# Patient Record
Sex: Female | Born: 1950 | Hispanic: No | Marital: Married | Smoking: Never smoker
Health system: Southern US, Community
[De-identification: ages and names within clinical notes are randomized; demographics above are authoritative.]

## PROBLEM LIST (undated history)

## (undated) DIAGNOSIS — G4730 Sleep apnea, unspecified: Secondary | ICD-10-CM

## (undated) DIAGNOSIS — I10 Essential (primary) hypertension: Secondary | ICD-10-CM

## (undated) DIAGNOSIS — D869 Sarcoidosis, unspecified: Secondary | ICD-10-CM

## (undated) HISTORY — PX: BREAST BIOPSY: SHX20

## (undated) HISTORY — DX: Sleep apnea, unspecified: G47.30

## (undated) HISTORY — DX: Essential (primary) hypertension: I10

## (undated) HISTORY — PX: BREAST EXCISIONAL BIOPSY: SUR124

## (undated) HISTORY — PX: NASAL SINUS SURGERY: SHX719

## (undated) HISTORY — DX: Sarcoidosis, unspecified: D86.9

---

## 1998-07-24 ENCOUNTER — Ambulatory Visit (HOSPITAL_COMMUNITY): Admission: RE | Admit: 1998-07-24 | Discharge: 1998-07-24 | Payer: Self-pay | Admitting: Cardiology

## 1999-07-23 ENCOUNTER — Other Ambulatory Visit: Admission: RE | Admit: 1999-07-23 | Discharge: 1999-07-23 | Payer: Self-pay | Admitting: Otolaryngology

## 1999-07-23 ENCOUNTER — Encounter (INDEPENDENT_AMBULATORY_CARE_PROVIDER_SITE_OTHER): Payer: Self-pay | Admitting: Specialist

## 2000-04-14 ENCOUNTER — Other Ambulatory Visit: Admission: RE | Admit: 2000-04-14 | Discharge: 2000-04-14 | Payer: Self-pay | Admitting: Otolaryngology

## 2000-04-14 ENCOUNTER — Encounter (INDEPENDENT_AMBULATORY_CARE_PROVIDER_SITE_OTHER): Payer: Self-pay | Admitting: Specialist

## 2000-09-30 ENCOUNTER — Encounter: Payer: Self-pay | Admitting: Urology

## 2000-09-30 ENCOUNTER — Encounter: Admission: RE | Admit: 2000-09-30 | Discharge: 2000-09-30 | Payer: Self-pay | Admitting: Urology

## 2001-01-20 ENCOUNTER — Encounter: Payer: Self-pay | Admitting: Internal Medicine

## 2001-01-20 ENCOUNTER — Ambulatory Visit (HOSPITAL_COMMUNITY): Admission: RE | Admit: 2001-01-20 | Discharge: 2001-01-20 | Payer: Self-pay | Admitting: Internal Medicine

## 2002-01-01 ENCOUNTER — Encounter: Payer: Self-pay | Admitting: Internal Medicine

## 2002-01-01 ENCOUNTER — Ambulatory Visit (HOSPITAL_COMMUNITY): Admission: RE | Admit: 2002-01-01 | Discharge: 2002-01-01 | Payer: Self-pay | Admitting: Internal Medicine

## 2002-01-10 ENCOUNTER — Encounter: Payer: Self-pay | Admitting: Internal Medicine

## 2002-01-10 ENCOUNTER — Encounter (INDEPENDENT_AMBULATORY_CARE_PROVIDER_SITE_OTHER): Payer: Self-pay | Admitting: *Deleted

## 2002-01-10 ENCOUNTER — Ambulatory Visit (HOSPITAL_COMMUNITY): Admission: RE | Admit: 2002-01-10 | Discharge: 2002-01-10 | Payer: Self-pay | Admitting: Internal Medicine

## 2002-05-16 ENCOUNTER — Ambulatory Visit (HOSPITAL_BASED_OUTPATIENT_CLINIC_OR_DEPARTMENT_OTHER): Admission: RE | Admit: 2002-05-16 | Discharge: 2002-05-16 | Payer: Self-pay | Admitting: Internal Medicine

## 2002-05-17 ENCOUNTER — Encounter: Admission: RE | Admit: 2002-05-17 | Discharge: 2002-05-17 | Payer: Self-pay | Admitting: Infectious Diseases

## 2003-09-10 ENCOUNTER — Encounter (INDEPENDENT_AMBULATORY_CARE_PROVIDER_SITE_OTHER): Payer: Self-pay | Admitting: Specialist

## 2003-09-10 ENCOUNTER — Ambulatory Visit (HOSPITAL_COMMUNITY): Admission: RE | Admit: 2003-09-10 | Discharge: 2003-09-10 | Payer: Self-pay | Admitting: *Deleted

## 2004-07-13 ENCOUNTER — Ambulatory Visit: Payer: Self-pay | Admitting: Internal Medicine

## 2005-01-06 ENCOUNTER — Ambulatory Visit: Payer: Self-pay | Admitting: Internal Medicine

## 2005-06-14 ENCOUNTER — Ambulatory Visit: Payer: Self-pay | Admitting: Internal Medicine

## 2005-07-08 ENCOUNTER — Ambulatory Visit: Payer: Self-pay | Admitting: Internal Medicine

## 2005-07-09 ENCOUNTER — Ambulatory Visit: Payer: Self-pay | Admitting: Internal Medicine

## 2006-08-10 ENCOUNTER — Ambulatory Visit: Payer: Self-pay | Admitting: Internal Medicine

## 2007-07-31 ENCOUNTER — Ambulatory Visit: Payer: Self-pay | Admitting: Internal Medicine

## 2007-07-31 DIAGNOSIS — J3089 Other allergic rhinitis: Secondary | ICD-10-CM

## 2007-07-31 DIAGNOSIS — D869 Sarcoidosis, unspecified: Secondary | ICD-10-CM

## 2007-07-31 DIAGNOSIS — J302 Other seasonal allergic rhinitis: Secondary | ICD-10-CM | POA: Insufficient documentation

## 2007-08-03 DIAGNOSIS — G4730 Sleep apnea, unspecified: Secondary | ICD-10-CM | POA: Insufficient documentation

## 2007-08-03 DIAGNOSIS — I10 Essential (primary) hypertension: Secondary | ICD-10-CM | POA: Insufficient documentation

## 2007-08-03 DIAGNOSIS — J45998 Other asthma: Secondary | ICD-10-CM | POA: Insufficient documentation

## 2008-04-23 ENCOUNTER — Encounter: Payer: Self-pay | Admitting: Internal Medicine

## 2008-04-23 ENCOUNTER — Telehealth: Payer: Self-pay | Admitting: Internal Medicine

## 2008-07-17 ENCOUNTER — Ambulatory Visit: Payer: Self-pay | Admitting: Internal Medicine

## 2009-01-27 ENCOUNTER — Telehealth: Payer: Self-pay | Admitting: Internal Medicine

## 2009-07-16 ENCOUNTER — Ambulatory Visit: Payer: Self-pay | Admitting: Internal Medicine

## 2010-03-30 ENCOUNTER — Other Ambulatory Visit: Payer: Self-pay | Admitting: Internal Medicine

## 2010-04-02 NOTE — Assessment & Plan Note (Signed)
Summary: 1 year return/mhh   Primary Provider/Referring Provider:  Renne Crigler  CC:  Yearly follow up visit-sarcoidosis and allergies; no complaints; needs 90 day refills for Advair, Singulair, and and ProAir HFA. Marland Kitchen  History of Present Illness: History of Present Illness: 6/09- First visit  in a long time for this woman followed with remote history of mild sarcoid, allergic rhinitis and asthma.  She had been previously evaluated for sleep apnea, but that improved, and she sleeps on her side.  Today she says she has been coughing since yesterday with chest tightness, but she does not think she has a cold.  Cough is not productive.  There has been been no fever or adenopathy, bloody or purulent discharge chest pain or palpitation,  GI upset or ankle edema.  We reviewed her medications.  Aug 05, 2008- Mild sarcoid, allergic rhinitis, asthma Had sinus surgery by Dr Annalee Genta- went well.  Breathing well now. Got through this spring better than last year, even at their house in [place].   Jul 16, 2009- Mild Sarcoid, allergic rhinitis, asthma Doing very well, coming for med refills. She thinks she is better for having had the sinus surgery last year. There have been no major events since last here. Uses flonase once each night. CXR last year showed NAD, no sign of active sarcoid.      Current Medications (verified): 1)  Advair Diskus 100-50 Mcg/dose  Misc (Fluticasone-Salmeterol) .... Inhale 1 Puff Two Times A Day 2)  Singulair 10 Mg  Tabs (Montelukast Sodium) .... Once Daily 3)  Micardis Hct 80-12.5 Mg Tabs (Telmisartan-Hctz) .... Take 1 By Mouth Once Daily 4)  Mirapex 0.125 Mg  Tabs (Pramipexole Dihydrochloride) .... 1/2 By Mouth At Bedtime 5)  Protonix 40 Mg  Tbec (Pantoprazole Sodium) .... Take 1 By Mouth Once Daily 6)  Proair Hfa 108 (90 Base) Mcg/act Aers (Albuterol Sulfate) .... Inhale 2 Puffs Every Four Hours As Needed 7)  Vitamin C 500 Mg Tabs (Ascorbic Acid) .... Take 1 By Mouth Once  Daily 8)  Fish Oil 1000 Mg Caps (Omega-3 Fatty Acids) .... Take 1 By Mouth Once Daily 9)  Calcium 500 Mg Tabs (Calcium Carbonate) .... Take 1 By Mouth Once Daily 10)  Glucosamine 1500 Complex  Caps (Glucosamine-Chondroit-Vit C-Mn) .... Take 1 By Mouth Once Daily 11)  Coq10 100 Mg Caps (Coenzyme Q10) .... Take 1 By Mouth Once Daily 12)  Fluticasone Propionate 50 Mcg/act Susp (Fluticasone Propionate) .Marland Kitchen.. 1-2 Sprays Each Nostril Daily  Allergies (verified): 1)  ! Vancomycin 2)  ! Codeine  Past History:  Past Medical History: Last updated: 07/31/2007 Sarcoidosis-stage I Allergic Rhinitis Asthma Hypertension  Hx of Sleep Apnea Insomnia  Past Surgical History: Last updated: 2008/08/05 Sinus surgery  Breast bx- benign  Family History: Last updated: August 05, 2008 Mother- deceased age 63; COPD Father- deceased age 50; PE 1 Brother-deceased age 72; Brain Tumor 4 Sisters- 3 living ages 41, 68, 4; hypertension                  1 deceased age 76; liver cancer.                    2 with breast cancer  Social History: Last updated: 07/31/2007 Married. No children Non-smoker Social ETOH.  Review of Systems      See HPI  The patient denies shortness of breath with activity, shortness of breath at rest, productive cough, non-productive cough, coughing up blood, chest pain, irregular heartbeats, acid heartburn, indigestion, loss of  appetite, weight change, difficulty swallowing, sore throat, tooth/dental problems, headaches, nasal congestion/difficulty breathing through nose, and sneezing.    Vital Signs:  Patient profile:   60 year old female Height:      67 inches Weight:      159 pounds BMI:     24.99 O2 Sat:      98 % on Room air Pulse rate:   80 / minute BP sitting:   132 / 80  (left arm) Cuff size:   regular  Vitals Entered By: Reynaldo Minium CMA (Jul 16, 2009 11:12 AM)  O2 Flow:  Room air  Physical Exam  Additional Exam:  General: A/Ox3; pleasant and cooperative,  NAD, SKIN: no rash, lesions NODES: no lymphadenopathy HEENT: [place]/AT, EOM- WNL, Conjuctivae- clear, PERRLA, TM-WNL, Nose- clear, Throat- clear and wnl. Mallampati  II NECK: Supple w/ fair ROM, JVD- none, normal carotid impulses w/o bruits Thyroid-  CHEST: Clear to P&A HEART: RRR, no m/g/r heard ABDOMEN:  ZOX:WRUE, nl pulses, no edema  NEURO: Grossly intact to observation      Impression & Recommendations:  Problem # 1:  ALLERGIC RHINITIS (ICD-477.9)  Good control with incidental itching eyes. She has access to eye drops if needed Her updated medication list for this problem includes:    Fluticasone Propionate 50 Mcg/act Susp (Fluticasone propionate) .Marland Kitchen... 1-2 sprays each nostril daily  Problem # 2:  ASTHMA (ICD-493.90) With maintnence advair and singulair, her control has been quite good. Never uses her Proair. Better control of sinus disease associated with better control of asthma as well.  Medications Added to Medication List This Visit: 1)  Micardis Hct 80-12.5 Mg Tabs (Telmisartan-hctz) .... Take 1 by mouth once daily  Other Orders: Est. Patient Level II (45409)  Patient Instructions: 1)  Please schedule a follow-up appointment in 1 year. 2)  Meds refilled Prescriptions: FLUTICASONE PROPIONATE 50 MCG/ACT SUSP (FLUTICASONE PROPIONATE) 1-2 sprays each nostril daily  #3 x 3   Entered and Authorized by:   Waymon Budge MD   Signed by:   Waymon Budge MD on 07/16/2009   Method used:   Print then Give to Patient   RxID:   [id number] PROAIR HFA 108 (90 BASE) MCG/ACT AERS (ALBUTEROL SULFATE) Inhale 2 puffs every four hours as needed  #3 x 3   Entered and Authorized by:   Waymon Budge MD   Signed by:   Waymon Budge MD on 07/16/2009   Method used:   Print then Give to Patient   RxID:   [id number] SINGULAIR 10 MG  TABS (MONTELUKAST SODIUM) once daily  #90 x 3   Entered and Authorized by:   Waymon Budge MD   Signed by:   Waymon Budge MD on  07/16/2009   Method used:   Print then Give to Patient   RxID:   [id number] ADVAIR DISKUS 100-50 MCG/DOSE  MISC (FLUTICASONE-SALMETEROL) Inhale 1 puff two times a day  #3 x 3   Entered and Authorized by:   Waymon Budge MD   Signed by:   Waymon Budge MD on 07/16/2009   Method used:   Print then Give to Patient   RxID:   [id number]

## 2010-07-10 ENCOUNTER — Ambulatory Visit (INDEPENDENT_AMBULATORY_CARE_PROVIDER_SITE_OTHER): Payer: Commercial Managed Care - PPO | Admitting: Internal Medicine

## 2010-07-10 ENCOUNTER — Encounter: Payer: Self-pay | Admitting: Internal Medicine

## 2010-07-10 VITALS — BP 104/64 | HR 70 | Ht 67.0 in | Wt 160.0 lb

## 2010-07-10 DIAGNOSIS — G4730 Sleep apnea, unspecified: Secondary | ICD-10-CM

## 2010-07-10 DIAGNOSIS — J45909 Unspecified asthma, uncomplicated: Secondary | ICD-10-CM

## 2010-07-10 DIAGNOSIS — D869 Sarcoidosis, unspecified: Secondary | ICD-10-CM

## 2010-07-10 DIAGNOSIS — J309 Allergic rhinitis, unspecified: Secondary | ICD-10-CM

## 2010-07-10 MED ORDER — FLUTICASONE PROPIONATE 50 MCG/ACT NA SUSP: 2.0000 | Freq: Every day | NASAL | Status: DC

## 2010-07-10 MED ORDER — FLUTICASONE-SALMETEROL 100-50 MCG/DOSE IN AEPB: 1.0000 | INHALATION_SPRAY | Freq: Two times a day (BID) | RESPIRATORY_TRACT | Status: DC

## 2010-07-10 MED ORDER — MONTELUKAST SODIUM 10 MG PO TABS: 10.0000 mg | ORAL_TABLET | Freq: Every day | ORAL | Status: DC

## 2010-07-10 NOTE — Patient Instructions (Signed)
Continue present meds- scripts refilled

## 2010-07-10 NOTE — Assessment & Plan Note (Addendum)
Mild seasonal exacerbation. Antihistamines, drops for allergic conjunctivitis, and fluticasone nasal spray have helped as needed.

## 2010-07-10 NOTE — Assessment & Plan Note (Signed)
Inactive, off CPAP after weight loss. Dr Renne Crigler treats restless legs with Mirapex.

## 2010-07-10 NOTE — Assessment & Plan Note (Signed)
Mild intermittent. Controlled with Advair- discussed. Consider reduction to once daily.

## 2010-07-10 NOTE — Progress Notes (Signed)
  Subjective:    Patient ID: Joy Rojas, female    DOB: 1950/10/18, 60 y.o.   MRN: [id number]  HPI 07/10/10- 61 yoF never smoker, followed for allergic rhinitis and hx mild intermittent asthma. Complicating past hx of Sarcoid, hypertension, sleep apnea with restless legs.  No significant respiratory events in past year. Minor Spring allergy eye burning and watering- managed with eye drops. Little nasal congestion or sneeze and no wheeze or chest tightness. She continues regular use of Advair, rare need for rescue inhaler. Using Singulair- discussed.  She is pleased and doesn't want changes.    Review of Systems See HPI Constitutional:   No weight loss, night sweats,  Fevers, chills, fatigue, lassitude. HEENT:   No headaches,  Difficulty swallowing,  Tooth/dental problems,  Sore throat,                No sneezing, itching, ear ache, nasal congestion, post nasal drip,   CV:  No chest pain,  Orthopnea, PND, swelling in lower extremities, anasarca, dizziness, palpitations  GI  No heartburn, indigestion, abdominal pain, nausea, vomiting, diarrhea, change in bowel habits, loss of appetite  Resp: No shortness of breath with exertion or at rest.  No excess mucus, no productive cough,  No non-productive cough,  No coughing up of blood.  No change in color of mucus.  No wheezing.  Skin: no rash or lesions.  GU: no dysuria, change in color of urine, no urgency or frequency.  No flank pain.  MS:  No joint pain or swelling.  No decreased range of motion.  No back pain.  Psych:  No change in mood or affect. No depression or anxiety.  No memory loss.      Objective:   Physical Exam General- Alert, Oriented, Affect-appropriate, Distress- none acute  Skin- rash-none, lesions- none, excoriation- none  Lymphadenopathy- none  Head- atraumatic  Eyes- Gross vision intact, PERRLA, conjunctivae clear secretions  Ears- Hearing, canals, Tm  Nose- Clear, No-  Septal dev, mucus, polyps, erosion,  perforation   Throat- Mallampati II , mucosa clear , drainage- none, tonsils- atrophic  Neck- flexible , trachea midline, no stridor , thyroid nl, carotid no bruit  Chest - symmetrical excursion , unlabored     Heart/CV- RRR , no murmur , no gallop  , no rub, nl s1 s2                     - JVD- none , edema- none, stasis changes- none, varices- none     Lung- clear to P&A, wheeze- none, cough- none , dullness-none, rub- none     Chest wall-   Abd- tender-no, distended-no, bowel sounds-present, HSM- no  Br/ Gen/ Rectal- Not done, not indicated  Extrem- cyanosis- none, clubbing, none, atrophy- none, strength- nl  Neuro- grossly intact to observation         Assessment & Plan:

## 2010-07-10 NOTE — Assessment & Plan Note (Signed)
In remission for years and probably a closed topic.

## 2010-07-14 NOTE — Assessment & Plan Note (Signed)
[facility]                             PULMONARY OFFICE NOTE   NAME:Joy Rojas, Joy Rojas                        MRN:          [id number]  DATE:08/10/2006                            DOB:          [birth year]    PROBLEMS:  1. Obstructive sleep apnea.  2. Hypertension.  3. Sarcoid, stage 1.  4. Allergic rhinitis/rhinosinusitis.  5. Asthma.  6. Insomnia.   HISTORY:  One year follow up. She has done very well. She had some rash  discussed with Dorinda Hill and treated with topical cortisone. Dr.  Renne Crigler had changed her blood pressure medication, because she had falling  episodes and she is seeing Dr. Lesia Sago for neurology evaluation  with a work up negative for specific findings or evidence of CNS sarcoid  as she understands it. She changed her pillow, has lost some weight, and  says that she is not snoring at all, and does not notice daytime  sleepiness.   MEDICATIONS:  1. Advair 100/50.  2. Singulair.  3. Benicar.  4. Mirapex 1/2 x0.125 mg nightly.  5. Protonix.   Drug intolerant VANCOMYCIN and CODEINE.   OBJECTIVE:  Weight 156 pounds, blood pressure 96/60, pulse 71, room air  saturation 98%. She looks quite well. I do not find adenopathy. Lung  fields are very clear. Heart sounds regular with no extra beats or  murmur. There is no peripheral edema.   IMPRESSION:  Currently stable.   PLAN:  I refilled Singulair and Advair. She will return again in a year,  earlier p.r.n.     Clinton D. Maple Hudson, MD, Tonny Bollman, FACP  Electronically Signed    CDY/MedQ  DD: 08/14/2006  DT: 08/15/2006  Job #: [id number]   cc:   Soyla Murphy. Renne Crigler, M.D.

## 2010-07-17 NOTE — Op Note (Signed)
NAME:  Joy Rojas, Joy Rojas                             ACCOUNT NO.:  [id number]   MEDICAL RECORD NO.:  [id number]                    PATIENT TYPE:   LOCATION:                                       FACILITY:   PHYSICIAN:  Clinton D. Maple Hudson, M.D.              DATE OF BIRTH:  12-29-1950   DATE OF PROCEDURE:  01/10/2002  DATE OF DISCHARGE:                                 OPERATIVE REPORT   PROCEDURE:  Bronchoscopy.   SURGEON:  Clinton D. Maple Hudson, M.D.   INDICATIONS FOR PROCEDURE:  60 year-old non-smoking woman with  persistent sinus congestion, bilateral hilar adenopathy on chest x-ray,  elevated angiotensin converting enzyme level, negative PPD.  She is  suspected to have sarcoidosis which may also be the basis for her persistent  sinus disease. Bronchoscopy is performed for tissue confirmation and  evaluation.  Preoperative evaluation revealed an oxygen saturation of 98%,  mild nasal congestion without visible drainage, no peripheral adenopathy,  quiet lung fields, normal heart sounds and no cyanosis or edema.  She is  considered stable for bronchoscopy.   MEDICATIONS:  1. Advair 100/50.  2. Flonase.  3. Singulair.  4. Cardizem CD 120 mg.   DESCRIPTION OF PROCEDURE:  After fully informed consent bronchoscopy was  performed in the endoscopy suite on an outpatient basis.  Premedication was  with Demerol and Atropine.  The upper airway was anesthetized topically with  Cetacaine spray, 1% Xylocaine.  Oxygen was provided at eight liters per  minute by nasal prongs achieving saturations over 90%.  The cardiac monitor  showed normal sinus rhythm.  A cumulative dose of 5 mg of intravenous Versed  was required during the procedure for additional sedation and cough control.  An Olympus fiberoptic bronchoscope was advanced via the right nostril to the  level of the vocal cords without difficulty.  The cords and trachea were  unremarkable and cough reflex was moderate.  The main carina was  normal.  Sequential examination of each lobar and segmental airway bilaterally to the  fourth division level revealed normal mucosa, clear secretions and normal  anatomy.  Under fluoroscopic guidance the bronchoscope was directed to the  right upper lobe and right middle lobe.  These areas were brushed, biopsied  by standard transbronchial biopsy and visually examined.  There was modest  self limited bleeding with no apparent complications pending chest x-ray.  She tolerated the procedure very well.  She will be held until stable and  then released home with family to office follow-up.   FINAL IMPRESSION:  Probable sarcoid.                                               Clinton D. Maple Hudson, M.D.   CDY/MEDQ  D:  01/10/2002  T:  01/10/2002  Job:  [id number]   cc:   Awanda Mink, M.D.

## 2010-07-17 NOTE — Op Note (Signed)
NAME:  Joy Rojas, Joy Rojas                           ACCOUNT NO.:  [id number]   MEDICAL RECORD NO.:  [id number]                   PATIENT TYPE:  AMB   LOCATION:  ENDO                                 FACILITY:  [facility]   PHYSICIAN:  Georgiana Spinner, M.D.                 DATE OF BIRTH:  02-01-1951   DATE OF PROCEDURE:  09/10/2003  DATE OF DISCHARGE:                                 OPERATIVE REPORT   PROCEDURE:  Upper endoscopy.   ENDOSCOPIST:  Georgiana Spinner, M.D.   INDICATIONS FOR PROCEDURE:  Hemoccult positivity, presumed iron deficiency  anemia.   ANESTHESIA:  Demerol 100 mg, Versed 8 mg.   DESCRIPTION OF PROCEDURE:  With the patient mildly sedated in the left  lateral decubitus position, the Olympus videoscopic endoscope was inserted  in the mouth and passed under direct vision to the esophagus, which appeared  normal, into the stomach.  The fundus, body, antrum, duodenal bulb and  second portion fo the duodenum were visualized.  From this point the  endoscope was slowly withdrawn, taking circumferential views of the duodenal  mucosa.  The endoscope was then pulled back into the stomach and placed on  retroflexion to view the stomach from below.  The endoscope was straightened  and withdrawn, taking circumferential views of the remaining gastric and  esophageal mucosa, stopping first in the antrum and next in the fundus where  polyps were seen, one in each area.  Both were photographed and both were  removed using biopsy forceps.  Tissue was retrieved for pathology.  The  endoscope was withdrawn.  The patient's vital signs and pulse oximeter  remained stable.  The patient tolerated the procedure well without apparent complications.   FINDINGS:  Very benign-appearing polyps, one in the antrum, one in the  fundus.  Both were removed.  I await the biopsy report.   DISPOSITION:  The patient will call me for results and follow up with me as  an outpatient.  Proceed to a colonoscopy is  planned.                                               Georgiana Spinner, M.D.    GMO/MEDQ  D:  09/10/2003  T:  09/10/2003  Job:  [id number]   cc:   S. Kyra Manges, M.D.  [phone] [address]  [place]  [place] [address]  Fax: [phone]

## 2010-07-17 NOTE — Op Note (Signed)
NAME:  Joy Rojas, Joy Rojas                           ACCOUNT NO.:  [id number]   MEDICAL RECORD NO.:  [id number]                   PATIENT TYPE:  AMB   LOCATION:  ENDO                                 FACILITY:  [facility]   PHYSICIAN:  Georgiana Spinner, M.D.                 DATE OF BIRTH:  1950-07-28   DATE OF PROCEDURE:  09/10/2003  DATE OF DISCHARGE:                                 OPERATIVE REPORT   PROCEDURE:  Colonoscopy.   ENDOSCOPIST:  Georgiana Spinner, M.D.   INDICATIONS FOR PROCEDURE:  Iron deficiency anemia, presumed Hemoccult  positivity.   ANESTHESIA:  Demerol 25 mg, Versed 2 mg.   DESCRIPTION OF PROCEDURE:  With the patient mildly sedated in the left  lateral decubitus position, the Olympus videoscopic colonoscope was inserted  into the rectum and passed under direct vision to the cecum.  I identified  the ileocecal valve and appendiceal orifice, both of which were  photographed.  From this point the colonoscope was then slowly withdrawn,  taking circumferential views of the colonic mucosa throughout the rectum,  which appeared normal on direct.  Showed small hemorrhoids on retroflexed  view.  The endoscope was straightened and withdrawn.  The patient's vital  signs and pulse oximeter remained stable.  The patient tolerated the procedure well, with no apparent complications.   IMPRESSION:  Unremarkable examination other than small internal hemorrhoids.   PLAN:  See the endoscopy note for further details.  May consider a  gynecological evaluation for evaluation of the blood loss anemia.                                               Georgiana Spinner, M.D.    GMO/MEDQ  D:  09/10/2003  T:  09/10/2003  Job:  [id number]   cc:   S. Kyra Manges, M.D.  [phone] [address]  [place]  [place] [address]  Fax: [phone]

## 2010-10-06 ENCOUNTER — Encounter: Payer: Self-pay | Admitting: *Deleted

## 2010-10-12 ENCOUNTER — Encounter: Payer: Self-pay | Admitting: Vascular Surgery

## 2010-10-13 ENCOUNTER — Encounter: Payer: Self-pay | Admitting: *Deleted

## 2010-10-15 ENCOUNTER — Ambulatory Visit (INDEPENDENT_AMBULATORY_CARE_PROVIDER_SITE_OTHER): Payer: Commercial Managed Care - PPO | Admitting: *Deleted

## 2010-10-15 DIAGNOSIS — I83893 Varicose veins of bilateral lower extremities with other complications: Secondary | ICD-10-CM

## 2010-10-15 NOTE — Progress Notes (Signed)
Subjective:     Patient ID: Joy Rojas, female   DOB: [birth year], 60 y.o.   MRN: [id number]  HPI   Review of Systems     Objective:   Physical Exam  Musculoskeletal:       Legs:      Assessment:         Plan:          X=.3% Sotradecol administered with a 27g butterfly.  Patient received a total of 11cc.   Photos: yes  Compression stockings applied: yes  Photos taken and compression hose size med. Applied. Ace wrap applied to left knee area over large reticular.

## 2010-10-15 NOTE — Progress Notes (Signed)
Subjective:     Patient ID: Joy Rojas, female   DOB: 02-19-1951, 60 y.o.   MRN: [id number]  HPI   Review of Systems        Physical Exam                   X=.3%* Sotradecol administered with a 27g butterfly.  Patient received a total of 11cc.  Photos: yes  Compression stockings applied: yes

## 2010-10-20 ENCOUNTER — Telehealth: Payer: Self-pay | Admitting: *Deleted

## 2010-10-20 NOTE — Telephone Encounter (Signed)
Pt. Called with a question about her stockings. They are irritating her skin at the thigh. I told her to either fold the band down or place a piece of cotton tee shirt between her skin and the hose.

## 2010-10-22 NOTE — Patient Instructions (Signed)
Pt. Given post op instructions.

## 2011-04-19 ENCOUNTER — Telehealth: Payer: Self-pay | Admitting: Internal Medicine

## 2011-04-19 MED ORDER — FLUTICASONE-SALMETEROL 100-50 MCG/DOSE IN AEPB: 1.0000 | INHALATION_SPRAY | Freq: Two times a day (BID) | RESPIRATORY_TRACT | Status: DC

## 2011-04-19 MED ORDER — MONTELUKAST SODIUM 10 MG PO TABS: 10.0000 mg | ORAL_TABLET | Freq: Every day | ORAL | Status: DC

## 2011-04-19 NOTE — Telephone Encounter (Signed)
I spoke with pt and was needing her advair and singulair rx sent to [facility]. I advised will send rx and nothing further was needed

## 2011-07-12 ENCOUNTER — Ambulatory Visit (INDEPENDENT_AMBULATORY_CARE_PROVIDER_SITE_OTHER): Payer: Commercial Managed Care - PPO | Admitting: Internal Medicine

## 2011-07-12 ENCOUNTER — Encounter: Payer: Self-pay | Admitting: Internal Medicine

## 2011-07-12 VITALS — BP 116/64 | HR 85 | Ht 67.0 in | Wt 164.0 lb

## 2011-07-12 DIAGNOSIS — G4730 Sleep apnea, unspecified: Secondary | ICD-10-CM

## 2011-07-12 DIAGNOSIS — J45998 Other asthma: Secondary | ICD-10-CM

## 2011-07-12 DIAGNOSIS — J45909 Unspecified asthma, uncomplicated: Secondary | ICD-10-CM

## 2011-07-12 DIAGNOSIS — D869 Sarcoidosis, unspecified: Secondary | ICD-10-CM

## 2011-07-12 MED ORDER — AZITHROMYCIN 250 MG PO TABS: ORAL_TABLET | ORAL | Status: AC

## 2011-07-12 NOTE — Progress Notes (Signed)
  Subjective:    Patient ID: Joy Rojas, female    DOB: 1950/04/21, 61 y.o.   MRN: [id number]  HPI 07/10/10- 77 yoF never smoker, followed for allergic rhinitis and hx mild intermittent asthma. Complicating past hx of Sarcoid, hypertension, sleep apnea with restless legs.  No significant respiratory events in past year. Minor Spring allergy eye burning and watering- managed with eye drops. Little nasal congestion or sneeze and no wheeze or chest tightness. She continues regular use of Advair, rare need for rescue inhaler. Using Singulair- discussed.  She is pleased and doesn't want changes.   07/12/11- 59 yoF never smoker, followed for allergic rhinitis and hx mild intermittent asthma. Complicating past hx of Sarcoid, hypertension, sleep apnea with restless legs.  Started yesterday with dry hacky cough; making throat sore from cough. She did not like generic Singulair and prefers brand name. She has kept her weight off and has not needed CPAP.  ROS-see HPI Constitutional:   No-   weight loss, night sweats, fevers, chills, fatigue, lassitude. HEENT:   No-  headaches, difficulty swallowing, tooth/dental problems, sore throat,       No-  sneezing, itching, ear ache, nasal congestion, post nasal drip,  CV:  No-   chest pain, orthopnea, PND, swelling in lower extremities, anasarca, dizziness, palpitations Resp: No-   shortness of breath with exertion or at rest.              No-   productive cough,  + non-productive cough,  No- coughing up of blood.              No-   change in color of mucus.  No- wheezing.   Skin: No-   rash or lesions. GI:  No-   heartburn, indigestion, abdominal pain, nausea, vomiting, GU:  MS:  No-   joint pain or swelling.   Neuro-     nothing unusual Psych:  No- change in mood or affect. No depression or anxiety.  No memory loss.  OBJ- Physical Exam General- Alert, Oriented, Affect-appropriate, Distress- none acute Skin- rash-none, lesions- none, excoriation-  none Lymphadenopathy- none Head- atraumatic            Eyes- Gross vision intact, PERRLA, conjunctivae and secretions clear            Ears- Hearing, canals-normal            Nose- Clear, no-Septal dev, mucus, polyps, erosion, perforation             Throat- Mallampati II , mucosa clear , drainage- none, tonsils- atrophic Neck- flexible , trachea midline, no stridor , thyroid nl, carotid no bruit Chest - symmetrical excursion , unlabored           Heart/CV- RRR , no murmur , no gallop  , no rub, nl s1 s2                           - JVD- none , edema- none, stasis changes- none, varices- none           Lung- clear to P&A, wheeze- none, cough- none , dullness-none, rub- none           Chest wall-  Abd-  Br/ Gen/ Rectal- Not done, not indicated Extrem- cyanosis- none, clubbing, none, atrophy- none, strength- nl Neuro- grossly intact to observation

## 2011-07-12 NOTE — Patient Instructions (Signed)
Script to hold for Z pak to use if needed  Fluids, rest and avoid getting chilled.  Please call as needed

## 2011-07-15 ENCOUNTER — Telehealth: Payer: Self-pay | Admitting: Internal Medicine

## 2011-07-15 MED ORDER — MONTELUKAST SODIUM 10 MG PO TABS: 10.0000 mg | ORAL_TABLET | Freq: Every day | ORAL | Status: DC

## 2011-07-15 MED ORDER — FLUTICASONE PROPIONATE 50 MCG/ACT NA SUSP: 2.0000 | Freq: Every day | NASAL | Status: DC

## 2011-07-15 MED ORDER — FLUTICASONE-SALMETEROL 100-50 MCG/DOSE IN AEPB: 1.0000 | INHALATION_SPRAY | Freq: Two times a day (BID) | RESPIRATORY_TRACT | Status: DC

## 2011-07-15 NOTE — Telephone Encounter (Signed)
Noted by triage Will sign off 

## 2011-07-15 NOTE — Telephone Encounter (Signed)
Patient last seen by CDY 5.13.13, follow up in 1 year > 5.19.14.  Refills sent to [facility] as requested 90-day supply with 3 refills.  LMOM TCB x1 to inform pt.  She is no longer at work for the day, so message was left on home answering machine.

## 2011-07-15 NOTE — Telephone Encounter (Signed)
Pt returned triage's call.  Advised pt hat refills were sent to [facility] as requested, 90 day supply w/ 3 refills.  Pt stated that if there is anything further needed, to please contact her on her cell, [phone].  Joy Rojas

## 2011-07-16 NOTE — Assessment & Plan Note (Addendum)
Good control. Recent mild hacking is nonspecific but probably related to the current pollen levels or a mild cold. After discussion of symptomatic therapy I agreed to give her a Z-Pak prescription to hold.

## 2011-07-16 NOTE — Assessment & Plan Note (Signed)
Controlled by weight loss 

## 2011-07-16 NOTE — Assessment & Plan Note (Signed)
Clinically in remission 

## 2011-07-31 ENCOUNTER — Emergency Department (HOSPITAL_COMMUNITY): Payer: Commercial Managed Care - PPO

## 2011-07-31 ENCOUNTER — Emergency Department (HOSPITAL_COMMUNITY)
Admission: EM | Admit: 2011-07-31 | Discharge: 2011-07-31 | Disposition: A | Discharge status: Home / Self Care | Payer: Commercial Managed Care - PPO | Attending: Emergency Medicine | Admitting: Emergency Medicine

## 2011-07-31 ENCOUNTER — Encounter (HOSPITAL_COMMUNITY): Payer: Self-pay | Admitting: Emergency Medicine

## 2011-07-31 DIAGNOSIS — I10 Essential (primary) hypertension: Secondary | ICD-10-CM | POA: Insufficient documentation

## 2011-07-31 DIAGNOSIS — G4730 Sleep apnea, unspecified: Secondary | ICD-10-CM | POA: Insufficient documentation

## 2011-07-31 DIAGNOSIS — D869 Sarcoidosis, unspecified: Secondary | ICD-10-CM | POA: Insufficient documentation

## 2011-07-31 DIAGNOSIS — Z79899 Other long term (current) drug therapy: Secondary | ICD-10-CM | POA: Insufficient documentation

## 2011-07-31 DIAGNOSIS — M79609 Pain in unspecified limb: Secondary | ICD-10-CM | POA: Insufficient documentation

## 2011-07-31 DIAGNOSIS — S93602A Unspecified sprain of left foot, initial encounter: Secondary | ICD-10-CM

## 2011-07-31 DIAGNOSIS — J45909 Unspecified asthma, uncomplicated: Secondary | ICD-10-CM | POA: Insufficient documentation

## 2011-07-31 NOTE — ED Provider Notes (Signed)
History     CSN: [id number]  Arrival date & time 07/31/11  1728   First MD Initiated Contact with Patient 07/31/11 1737      Chief Complaint  Patient presents with  . Foot Pain    (Consider location/radiation/quality/duration/timing/severity/associated sxs/prior treatment) HPI Comments: Patient reports that while she was getting out of her car earlier today she thinks she may have twisted her left foot and has been having left foot pain since that time.  Pain located in the area of the foot arch medially.  She denies any edema, erythema, or warmth of the area.  Pain worse with ambulation.  She denies any numbness.  She reports that she has never had pain like this before.  Patient is a 61 y.o. female presenting with lower extremity pain. The history is provided by the patient.  Foot Pain This is a new problem. The current episode started today. The problem occurs constantly. The problem has been unchanged. Pertinent negatives include no chills, fever, joint swelling or numbness. The symptoms are aggravated by bending and walking. She has tried nothing for the symptoms.    Past Medical History  Diagnosis Date  . Sarcoidosis     stage 1  . Allergic rhinitis   . Asthma   . Hypertension   . Sleep apnea     Past Surgical History  Procedure Date  . Nasal sinus surgery   . Breast biopsy     benign; lumpectomy, cyst removal    Family History  Problem Relation Age of Onset  . COPD Mother   . Pulmonary embolism Father   . Other Brother     brain tumor  . Hypertension Sister   . Liver cancer Sister   . Breast cancer      History  Substance Use Topics  . Smoking status: Never Smoker   . Smokeless tobacco: Never Used  . Alcohol Use: 0.6 oz/week    1 Glasses of wine per week     social use    OB History    Grav Para Term Preterm Abortions TAB SAB Ect Mult Living                  Review of Systems  Constitutional: Negative for fever and chills.  Musculoskeletal:  Negative for joint swelling.       Pain with ambulation  Skin: Negative for color change and wound.  Neurological: Negative for numbness.    Allergies  Codeine and Vancomycin  Home Medications   Current Outpatient Rx  Name Route Sig Dispense Refill  . ALBUTEROL SULFATE HFA 108 (90 BASE) MCG/ACT IN AERS Inhalation Inhale 2 puffs into the lungs 4 (four) times daily.      . COQ10 100 MG PO CAPS Oral Take 1 capsule by mouth daily.      Marland Kitchen FLUTICASONE PROPIONATE 50 MCG/ACT NA SUSP Nasal Place 2 sprays into the nose daily. 48 g 3  . FLUTICASONE-SALMETEROL 100-50 MCG/DOSE IN AEPB Inhalation Inhale 1 puff into the lungs every 12 (twelve) hours. 180 each 3  . GLUCOSAMINE 1500 COMPLEX PO CAPS Oral Take 1 capsule by mouth daily.      Marland Kitchen MONTELUKAST SODIUM 10 MG PO TABS Oral Take 1 tablet (10 mg total) by mouth at bedtime. 90 tablet 3    Dispense as written.  Marland Kitchen OLMESARTAN MEDOXOMIL-HCTZ 40-12.5 MG PO TABS Oral Take 1 tablet by mouth daily.      Marland Kitchen FISH OIL 1000 MG PO CAPS Oral Take  2 capsules by mouth daily.     Marland Kitchen PANTOPRAZOLE SODIUM 40 MG PO TBEC Oral Take 40 mg by mouth daily.      Marland Kitchen PRAMIPEXOLE DIHYDROCHLORIDE 0.125 MG PO TABS  Take 1/2 by mouth at bedtime     . VITAMIN C 500 MG PO TABS Oral Take 500 mg by mouth daily.        BP 109/66  Pulse 84  Temp(Src) 98.1 F (36.7 C) (Oral)  SpO2 98%  Physical Exam  Nursing note and vitals reviewed. Constitutional: She appears well-developed and well-nourished. No distress.  HENT:  Head: Normocephalic and atraumatic.  Cardiovascular: Normal rate, regular rhythm and normal heart sounds.   Pulses:      Dorsalis pedis pulses are 2+ on the right side, and 2+ on the left side.  Pulmonary/Chest: Effort normal and breath sounds normal.  Musculoskeletal: Normal range of motion.       Left ankle: She exhibits normal range of motion, no swelling, no ecchymosis and no deformity. no tenderness. Achilles tendon normal. Achilles tendon exhibits no pain and no  defect.       Pain with plantar and dorsiflexion of left foot.  Neurological: She is alert. No sensory deficit.       Pain with ambulation  Skin: Skin is warm and dry. She is not diaphoretic.       No erythema, edema, or warmth of left foot.  Psychiatric: She has a normal mood and affect.    ED Course  Procedures (including critical care time)  Labs Reviewed - No data to display Dg Foot Complete Left  07/31/2011  *RADIOLOGY REPORT*  Clinical Data: Sudden left foot pain in the region of the first toe and metacarpal.  LEFT FOOT - COMPLETE 3+ VIEW  Comparison: None.  Findings: Joint space narrowing and spur formation involving the first MTP joint.  Mild calcaneal spur formation.  No fracture or dislocation seen.  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  Original Report Authenticated By: Darrol Angel, M.D.     No diagnosis found.    MDM  Patient with foot pain after twisting her foot.  Negative xrays.  Neurovascularly intact.  No erythema, warmth, or swelling.  Patient given Orthopedic boot and instructed to follow up with PCP.  Patient in agreement with the plan.          Pascal Lux [place], PA-C 08/01/11 [phone]

## 2011-07-31 NOTE — ED Notes (Addendum)
Pt c/o of left foot pain that started when she got out of the car. Reports no trauma/fall/injury; just got out of the car and suddenly felt foot pain. No hx of DVT. Limited movement on left foot upon dorsiflexion and plantar flexion. Foot is painful to the touch. Bunion noted by left great toe.

## 2011-08-01 NOTE — ED Provider Notes (Signed)
Medical screening examination/treatment/procedure(s) were performed by non-physician practitioner and as supervising physician I was immediately available for consultation/collaboration.  Skyelyn Scruggs T Briunna Leicht, MD 08/01/11 1559 

## 2012-07-17 ENCOUNTER — Encounter: Payer: Self-pay | Admitting: Internal Medicine

## 2012-07-17 ENCOUNTER — Ambulatory Visit (INDEPENDENT_AMBULATORY_CARE_PROVIDER_SITE_OTHER): Payer: Commercial Managed Care - PPO | Admitting: Internal Medicine

## 2012-07-17 VITALS — BP 124/78 | HR 95 | Ht 67.0 in | Wt 167.0 lb

## 2012-07-17 DIAGNOSIS — D869 Sarcoidosis, unspecified: Secondary | ICD-10-CM

## 2012-07-17 DIAGNOSIS — J309 Allergic rhinitis, unspecified: Secondary | ICD-10-CM

## 2012-07-17 DIAGNOSIS — J45998 Other asthma: Secondary | ICD-10-CM

## 2012-07-17 DIAGNOSIS — G4730 Sleep apnea, unspecified: Secondary | ICD-10-CM

## 2012-07-17 DIAGNOSIS — J3089 Other allergic rhinitis: Secondary | ICD-10-CM

## 2012-07-17 DIAGNOSIS — J45909 Unspecified asthma, uncomplicated: Secondary | ICD-10-CM

## 2012-07-17 MED ORDER — ALBUTEROL SULFATE HFA 108 (90 BASE) MCG/ACT IN AERS: 2.0000 | INHALATION_SPRAY | RESPIRATORY_TRACT | Status: DC | PRN

## 2012-07-17 MED ORDER — FLUTICASONE PROPIONATE 50 MCG/ACT NA SUSP: 2.0000 | Freq: Every day | NASAL | Status: DC

## 2012-07-17 MED ORDER — MONTELUKAST SODIUM 10 MG PO TABS: 10.0000 mg | ORAL_TABLET | Freq: Every day | ORAL | Status: DC

## 2012-07-17 MED ORDER — FLUTICASONE-SALMETEROL 100-50 MCG/DOSE IN AEPB: 1.0000 | INHALATION_SPRAY | Freq: Two times a day (BID) | RESPIRATORY_TRACT | Status: DC

## 2012-07-17 NOTE — Progress Notes (Signed)
Subjective:    Patient ID: Joy Rojas, female    DOB: 29-Dec-1950, 62 y.o.   MRN: [id number]  HPI 07/10/10- 42 yoF never smoker, followed for allergic rhinitis and hx mild intermittent asthma. Complicating past hx of Sarcoid, hypertension, sleep apnea with restless legs.  No significant respiratory events in past year. Minor Spring allergy eye burning and watering- managed with eye drops. Little nasal congestion or sneeze and no wheeze or chest tightness. She continues regular use of Advair, rare need for rescue inhaler. Using Singulair- discussed.  She is pleased and doesn't want changes.   07/12/11- 59 yoF never smoker, followed for allergic rhinitis and hx mild intermittent asthma. Complicating past hx of Sarcoid, hypertension, sleep apnea with restless legs.  Started yesterday with dry hacky cough; making throat sore from cough. She did not like generic Singulair and prefers brand name. She has kept her weight off and has not needed CPAP.  07/17/12- 61 yoF never smoker, followed for allergic rhinitis and hx mild intermittent asthma. FOLLOWS FOR: denies any troubles/flare ups with allergies or asthma at this time. Breathing has been stable with no acute problems. She mentions and intermittent upper thoracic spine pain that reminds her of how she felt when she first developed sarcoid. She denies fever, cough, swollen glands, rash.  ROS-see HPI Constitutional:   No-   weight loss, night sweats, fevers, chills, fatigue, lassitude. HEENT:   No-  headaches, difficulty swallowing, tooth/dental problems, sore throat,       No-  sneezing, itching, ear ache, nasal congestion, post nasal drip,  CV:  No-   chest pain, orthopnea, PND, swelling in lower extremities, anasarca, dizziness, palpitations Resp: No-   shortness of breath with exertion or at rest.              No-   productive cough,  No- non-productive cough,  No- coughing up of blood.              No-   change in color of mucus.  No-  wheezing.   Skin: No-   rash or lesions. GI:  No-   heartburn, indigestion, abdominal pain, nausea, vomiting, GU:  MS:  No-   joint pain or swelling.   Neuro-     nothing unusual Psych:  No- change in mood or affect. No depression or anxiety.  No memory loss.  OBJ- Physical Exam General- Alert, Oriented, Affect-appropriate, Distress- none acute Skin- rash-none, lesions- none, excoriation- none Lymphadenopathy- none Head- atraumatic            Eyes- Gross vision intact, PERRLA, conjunctivae and secretions clear            Ears- Hearing, canals-normal            Nose- Clear, no-Septal dev, mucus, polyps, erosion, perforation             Throat- Mallampati II , mucosa clear , drainage- none, tonsils- atrophic Neck- flexible , trachea midline, no stridor , thyroid nl, carotid no bruit Chest - symmetrical excursion , unlabored           Heart/CV- RRR , no murmur , no gallop  , no rub, nl s1 s2                           - JVD- none , edema- none, stasis changes- none, varices- none           Lung- clear to P&A, wheeze-  none, cough- none , dullness-none, rub- none           Chest wall- not shock tender over thoracic spine Abd-  Br/ Gen/ Rectal- Not done, not indicated Extrem- cyanosis- none, clubbing, none, atrophy- none, strength- nl Neuro- grossly intact to observation

## 2012-07-17 NOTE — Patient Instructions (Addendum)
Scripts sent to [facility] for Singulair, Advair, [employer] for [employer]- CXR   Dx sarcoid

## 2012-07-19 ENCOUNTER — Telehealth: Payer: Self-pay | Admitting: Internal Medicine

## 2012-07-19 MED ORDER — MONTELUKAST SODIUM 10 MG PO TABS: 10.0000 mg | ORAL_TABLET | Freq: Every day | ORAL | Status: DC

## 2012-07-19 NOTE — Telephone Encounter (Signed)
i confirmed with [facility] they did NOT receive RX. I have resent this in. I called and made pt aware as well.

## 2012-07-26 NOTE — Assessment & Plan Note (Signed)
Most likely she is experiencing some arthritic/skeletal pain. I doubt which she describes related to sarcoid. Plan-chest x-ray

## 2012-07-26 NOTE — Assessment & Plan Note (Signed)
Plan-refill Flonase with discussion 

## 2012-07-26 NOTE — Assessment & Plan Note (Addendum)
Husband has told her she has little snoring and no obvious apnea any longer. we will consider this problem resolved.

## 2012-10-13 ENCOUNTER — Ambulatory Visit (INDEPENDENT_AMBULATORY_CARE_PROVIDER_SITE_OTHER)
Admission: RE | Admit: 2012-10-13 | Discharge: 2012-10-13 | Disposition: A | Discharge status: Home / Self Care | Payer: Commercial Managed Care - PPO | Source: Ambulatory Visit | Attending: Internal Medicine | Admitting: Internal Medicine

## 2012-10-13 DIAGNOSIS — D869 Sarcoidosis, unspecified: Secondary | ICD-10-CM

## 2012-10-25 ENCOUNTER — Other Ambulatory Visit: Payer: Self-pay | Admitting: Physician Assistant

## 2012-11-09 ENCOUNTER — Encounter: Payer: Self-pay | Admitting: Sports Medicine

## 2012-11-09 ENCOUNTER — Ambulatory Visit (INDEPENDENT_AMBULATORY_CARE_PROVIDER_SITE_OTHER): Payer: Commercial Managed Care - PPO | Admitting: Sports Medicine

## 2012-11-09 VITALS — BP 122/76 | Ht 67.0 in | Wt 167.0 lb

## 2012-11-09 DIAGNOSIS — M1710 Unilateral primary osteoarthritis, unspecified knee: Secondary | ICD-10-CM

## 2012-11-09 DIAGNOSIS — M25569 Pain in unspecified knee: Secondary | ICD-10-CM

## 2012-11-09 DIAGNOSIS — M25562 Pain in left knee: Secondary | ICD-10-CM

## 2012-11-09 DIAGNOSIS — M1712 Unilateral primary osteoarthritis, left knee: Secondary | ICD-10-CM

## 2012-11-09 NOTE — Progress Notes (Signed)
Patient ID: Joy Rojas, female   DOB: 1950/10/19, 62 y.o.   MRN: [id number]  This is a 62 year old female who presents with approximately 2 years of left knee pain. She reports onset of the pain gradually. No radiation of the pain is exacerbated by prolonged standing. She notes some swelling when she stands for extended periods. 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 On this day she notes fairly significant discomfort swelling and limited range of motion.  Denies any inciting injury. No pain with the right knee.  Past Medical History  Diagnosis Date  . Sarcoidosis     stage 1  . Allergic rhinitis   . Asthma   . Hypertension   . Sleep apnea    Past Surgical History  Procedure Laterality Date  . Nasal sinus surgery    . Breast biopsy      benign; lumpectomy, cyst removal   Allergies  Allergen Reactions  . Codeine     REACTION: nausea  . Vancomycin     REACTION: asthma attack    Review of systems: As per history of present illness otherwise negative.  Examination:  BP 122/76  Ht 5\' 7"  (1.702 m)  Wt 167 lb (75.751 kg)  BMI 26.15 kg/m2 This is a well-developed well-nourished 62 year old [place] female awake alert and oriented in no acute distress.  Left Knee: Inspection with slight joint effusion noted. Palpation with no warmth, positive for medial joint line tenderness and patellar crepitus with extension. ROM limited in flexion to approximately 110 degrees.  Normal extension Ligaments with solid consistent endpoints including ACL, PCL, LCL, MCL. Positive McMurray on left Non painful patellar compression. Patellar glide with crepitus. Patellar and quadriceps tendons unremarkable. Hamstring and quadriceps strength is normal.   Hip strength 5/5 to Flexion, Abduction, Adduction and Extension.  Musculoskeletal ultrasound of the left knee was performed. Findings of the ultrasound consisted of  medial meniscal extrusion with some surrounding edema. Fluid in the suprapatellar pouch and bony spurring on the tibial plateau medially and laterally. In addition there are cortical irregularities noted within the patellofemoral groove as well as the underside of the patella.

## 2012-11-09 NOTE — Assessment & Plan Note (Signed)
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 She was given a Body Helix compressive sleeve to wear for support. In addition oral anti-inflammatories as tolerated. She was given quadriceps strengthening exercises to do at home to help improve joint strength and range of motion.

## 2012-11-13 ENCOUNTER — Telehealth: Payer: Self-pay | Admitting: *Deleted

## 2012-11-13 NOTE — Telephone Encounter (Signed)
Pt called stating when she wore her knee sleeve for several hours her ankle got swollen.  Advised her to try wearing for activity and for 30 min - 1 hour after.  Advised not to wear until swelling occurs- ok to wear as tolerated during the day.  Asked pt to call back if she has any more significant ankle swelling.

## 2012-11-23 ENCOUNTER — Encounter: Payer: Self-pay | Admitting: Physician Assistant

## 2012-11-23 ENCOUNTER — Ambulatory Visit (INDEPENDENT_AMBULATORY_CARE_PROVIDER_SITE_OTHER): Payer: Commercial Managed Care - PPO | Admitting: Physician Assistant

## 2012-11-23 VITALS — BP 130/80 | HR 68 | Temp 97.8°F | Resp 18 | Ht 65.0 in | Wt 175.0 lb

## 2012-11-23 DIAGNOSIS — Z01419 Encounter for gynecological examination (general) (routine) without abnormal findings: Secondary | ICD-10-CM

## 2012-11-23 DIAGNOSIS — Z1239 Encounter for other screening for malignant neoplasm of breast: Secondary | ICD-10-CM

## 2012-11-23 NOTE — Progress Notes (Signed)
Patient ID: Joy Rojas MRN: [id number], DOB: September 10, 1950, 62 y.o. Date of Encounter: 11/23/2012, 8:51 AM    Chief Complaint:  Chief Complaint  Patient presents with  . PAP only     HPI: 62 y.o. year old female here for GYN exam including breast exam and pelvic exam. She saw Dr. Lelon Perla in the past for her GYN care. Since he retired she has been seeing me for this. She goes to [facility] for her routine general medical care.  She has had no abnormal GYN history. She has had no children. She has had no hysterectomy. She is on no GYN medications. On no hormone replacement therapy etc. She has no personal history of any GYN cancers. However she does have 2 sisters who have had breast cancer. No other family history of breast cancer. There is no family history of any uterine or ovarian cancers.  She has no complaints today. She just had a mammogram earlier this week and already knows that the results were normal.     Home Meds: See attached medication section for any medications that were entered at today's visit. The computer does not put those onto this list.The following list is a list of meds entered prior to today's visit.   Current Outpatient Prescriptions on File Prior to Visit  Medication Sig Dispense Refill  . albuterol (PROAIR HFA) 108 (90 BASE) MCG/ACT inhaler Inhale 2 puffs into the lungs every 4 (four) hours as needed for wheezing or shortness of breath.  1 Inhaler  prn  . Coenzyme Q10 (COQ10) 100 MG CAPS Take 1 capsule by mouth daily.        . fluticasone (FLONASE) 50 MCG/ACT nasal spray Place 2 sprays into the nose daily.  48 g  3  . Fluticasone-Salmeterol (ADVAIR DISKUS) 100-50 MCG/DOSE AEPB Inhale 1 puff into the lungs every 12 (twelve) hours. Rinse mouth  180 each  3  . Glucosamine-Chondroit-Vit C-Mn (GLUCOSAMINE 1500 COMPLEX) CAPS Take 1 capsule by mouth daily.        . montelukast (SINGULAIR) 10 MG tablet Take 1 tablet (10 mg total) by mouth at bedtime.  90  tablet  3  . olmesartan-hydrochlorothiazide (BENICAR HCT) 40-12.5 MG per tablet Take 1 tablet by mouth daily.        . Omega-3 Fatty Acids (FISH OIL) 1000 MG CAPS Take 2 capsules by mouth daily.       . pantoprazole (PROTONIX) 40 MG tablet Take 40 mg by mouth daily.        . pramipexole (MIRAPEX) 0.125 MG tablet Take 1/2 by mouth at bedtime        No current facility-administered medications on file prior to visit.    Allergies:  Allergies  Allergen Reactions  . Codeine     REACTION: nausea  . Vancomycin     REACTION: asthma attack      Review of Systems: See HPI for pertinent ROS. All other ROS negative.    Physical Exam: Blood pressure 130/80, pulse 68, temperature 97.8 F (36.6 C), temperature source Oral, resp. rate 18, height 5\' 5"  (1.651 m), weight 175 lb (79.379 kg)., Body mass index is 29.12 kg/(m^2). General: Well-nourished well-developed female. Very pleasant. Appears in no acute distress. Neck: Supple. No thyromegaly. No lymphadenopathy. Lungs: Clear bilaterally to auscultation without wheezes, rales, or rhonchi. Breathing is unlabored. Heart: Regular rhythm. No murmurs, rubs, or gallops. Abdomen: Soft, non-tender, non-distended with normoactive bowel sounds. No hepatomegaly. No rebound/guarding. No obvious abdominal masses. Breast exam:  Breasts are symmetrical bilaterally. They are soft with no fibrocystic changes and no masses. No abnormalities of the skin. No nipple discharge. No enlarged or tender lymph nodes. Pelvic exam: External genitalia are normal. Vaginal mucosa normal. Cervix is normal. Bimanual exam is normal with no mass and no area of tenderness. Msk:  Strength and tone normal for age. Psych:  Responds to questions appropriately with a normal affect.very pleasant .     ASSESSMENT AND PLAN:  62 y.o. year old female with  1. Screening breast examination Normal. Discussed monthly self breast exams. She just had mammogram earlier this week that was  normal.  2. Visit for pelvic exam Normal. Not need repeat Pap smear at this time. Last Pap smear was 11/09/2010. Cytology was normal. HPV was negative.  Followup GYN exam in 1 year.   Murray [place], [place], [facility] 11/23/2012 8:51 AM

## 2013-01-03 ENCOUNTER — Ambulatory Visit: Payer: Commercial Managed Care - PPO | Admitting: Sports Medicine

## 2013-04-09 ENCOUNTER — Telehealth: Payer: Self-pay | Admitting: Internal Medicine

## 2013-04-09 MED ORDER — MONTELUKAST SODIUM 10 MG PO TABS: 10.0000 mg | ORAL_TABLET | Freq: Every day | ORAL | Status: DC

## 2013-04-09 NOTE — Telephone Encounter (Signed)
Rx has been sent in. Pt is aware. 

## 2013-05-28 ENCOUNTER — Telehealth: Payer: Self-pay | Admitting: Internal Medicine

## 2013-05-28 MED ORDER — FLUTICASONE-SALMETEROL 100-50 MCG/DOSE IN AEPB: 1.0000 | INHALATION_SPRAY | Freq: Two times a day (BID) | RESPIRATORY_TRACT | Status: DC

## 2013-05-28 NOTE — Telephone Encounter (Signed)
Rx has been sent in. Pt has upcoming appointment with CY in 06/2013. I will not call pt per her request.

## 2013-07-16 ENCOUNTER — Encounter: Payer: Self-pay | Admitting: Internal Medicine

## 2013-07-16 ENCOUNTER — Encounter (INDEPENDENT_AMBULATORY_CARE_PROVIDER_SITE_OTHER): Payer: Self-pay

## 2013-07-16 ENCOUNTER — Ambulatory Visit (INDEPENDENT_AMBULATORY_CARE_PROVIDER_SITE_OTHER): Payer: PRIVATE HEALTH INSURANCE | Admitting: Internal Medicine

## 2013-07-16 VITALS — BP 124/80 | HR 83 | Ht 67.0 in | Wt 170.0 lb

## 2013-07-16 DIAGNOSIS — D869 Sarcoidosis, unspecified: Secondary | ICD-10-CM

## 2013-07-16 DIAGNOSIS — J45998 Other asthma: Secondary | ICD-10-CM

## 2013-07-16 DIAGNOSIS — J302 Other seasonal allergic rhinitis: Secondary | ICD-10-CM

## 2013-07-16 DIAGNOSIS — J45909 Unspecified asthma, uncomplicated: Secondary | ICD-10-CM

## 2013-07-16 DIAGNOSIS — J309 Allergic rhinitis, unspecified: Secondary | ICD-10-CM

## 2013-07-16 DIAGNOSIS — J3089 Other allergic rhinitis: Secondary | ICD-10-CM

## 2013-07-16 MED ORDER — MONTELUKAST SODIUM 10 MG PO TABS: 10.0000 mg | ORAL_TABLET | Freq: Every day | ORAL | Status: DC

## 2013-07-16 MED ORDER — FLUTICASONE-SALMETEROL 100-50 MCG/DOSE IN AEPB: 1.0000 | INHALATION_SPRAY | Freq: Two times a day (BID) | RESPIRATORY_TRACT | Status: DC

## 2013-07-16 MED ORDER — ALBUTEROL SULFATE HFA 108 (90 BASE) MCG/ACT IN AERS: 2.0000 | INHALATION_SPRAY | RESPIRATORY_TRACT | Status: DC | PRN

## 2013-07-16 NOTE — Progress Notes (Signed)
Subjective:    Patient ID: Joy Rojas, female    DOB: Jul 27, 1950, 63 y.o.   MRN: [id number]  HPI 07/10/10- 65 yoF never smoker, followed for allergic rhinitis and hx mild intermittent asthma. Complicating past hx of Sarcoid, hypertension, sleep apnea with restless legs.  No significant respiratory events in past year. Minor Spring allergy eye burning and watering- managed with eye drops. Little nasal congestion or sneeze and no wheeze or chest tightness. She continues regular use of Advair, rare need for rescue inhaler. Using Singulair- discussed.  She is pleased and doesn't want changes.   07/12/11- 59 yoF never smoker, followed for allergic rhinitis and hx mild intermittent asthma. Complicating past hx of Sarcoid, hypertension, sleep apnea with restless legs.  Started yesterday with dry hacky cough; making throat sore from cough. She did not like generic Singulair and prefers brand name. She has kept her weight off and has not needed CPAP.  07/17/12- 60 yoF never smoker, followed for allergic rhinitis and hx mild intermittent asthma. FOLLOWS FOR: denies any troubles/flare ups with allergies or asthma at this time. Breathing has been stable with no acute problems. She mentions and intermittent upper thoracic spine pain that reminds her of how she felt when she first developed sarcoid. She denies fever, cough, swollen glands, rash.  07/16/13- 20 yoF never smoker, followed for allergic rhinitis and hx mild intermittent asthma, complicated by hx sarcoid, restless legs FOLLOWS FOR: doing well over all; needs NAME BRAND Sinuglair, Fluticasone, and Advair refills-[facility]. We discussed medications. She feels brand-name Singulair works much better. Otherwise doing very well with minor bronchitis cold during the winter CXR 11/01/12 IMPRESSION:  No active disease. No significant change.  Original Report Authenticated By: Lahoma Crocker, M.D.   ROS-see HPI Constitutional:   No-   weight loss, night sweats,  fevers, chills, fatigue, lassitude. HEENT:   No-  headaches, difficulty swallowing, tooth/dental problems, sore throat,       No-  sneezing, itching, ear ache, nasal congestion, post nasal drip,  CV:  No-   chest pain, orthopnea, PND, swelling in lower extremities, anasarca, dizziness, palpitations Resp: No-   shortness of breath with exertion or at rest.              No-   productive cough,  No- non-productive cough,  No- coughing up of blood.              No-   change in color of mucus.  No- wheezing.   Skin: No-   rash or lesions. GI:  No-   heartburn, indigestion, abdominal pain, nausea, vomiting, GU:  MS:  No-   joint pain or swelling.   Neuro-     nothing unusual Psych:  No- change in mood or affect. No depression or anxiety.  No memory loss.  OBJ- Physical Exam General- Alert, Oriented, Affect-appropriate, Distress- none acute Skin- rash-none, lesions- none, excoriation- none Lymphadenopathy- none Head- atraumatic            Eyes- Gross vision intact, PERRLA, conjunctivae and secretions clear            Ears- Hearing, canals-normal            Nose- Clear, no-Septal dev, mucus, polyps, erosion, perforation             Throat- Mallampati II , mucosa clear , drainage- none, tonsils- atrophic Neck- flexible , trachea midline, no stridor , thyroid nl, carotid no bruit Chest - symmetrical excursion , unlabored  Heart/CV- RRR , no murmur , no gallop  , no rub, nl s1 s2                           - JVD- none , edema- none, stasis changes- none, varices- none           Lung- clear to P&A, wheeze- none, cough- none , dullness-none, rub- none           Chest wall- not shock tender over thoracic spine Abd-  Br/ Gen/ Rectal- Not done, not indicated Extrem- cyanosis- none, clubbing, none, atrophy- none, strength- nl Neuro- grossly intact to observation

## 2013-07-16 NOTE — Patient Instructions (Addendum)
Refill scripts sent for Singulair, albuterol rescue inhaler and Advair were sent to [facility].  We will cancel the scripts sent in erro to [facility]  Flonase/ fluticasone or Nasacort are now available otc

## 2013-08-26 NOTE — Assessment & Plan Note (Signed)
Minor seasonal symptoms controlled with over-the-counter meds

## 2013-08-26 NOTE — Assessment & Plan Note (Signed)
Long term remission 

## 2013-08-26 NOTE — Assessment & Plan Note (Signed)
Mild, intermittent. We discussed and are refilling her medications. She believes she does not tolerate generic Singulair and requests brand name. Medications reviewed and refills ordered

## 2013-10-15 ENCOUNTER — Telehealth: Payer: Self-pay | Admitting: Internal Medicine

## 2013-10-15 NOTE — Telephone Encounter (Signed)
lmtcb x1 

## 2013-10-16 MED ORDER — FLUTICASONE PROPIONATE 50 MCG/ACT NA SUSP: 2.0000 | Freq: Every day | NASAL | Status: DC

## 2013-10-16 NOTE — Telephone Encounter (Signed)
Refill sent, pt aware. Joy Rojas, CMA  

## 2013-12-05 ENCOUNTER — Other Ambulatory Visit: Payer: Self-pay | Admitting: Internal Medicine

## 2013-12-05 DIAGNOSIS — R5381 Other malaise: Secondary | ICD-10-CM

## 2013-12-05 DIAGNOSIS — N63 Unspecified lump in unspecified breast: Secondary | ICD-10-CM

## 2013-12-13 ENCOUNTER — Telehealth: Payer: Self-pay | Admitting: Internal Medicine

## 2013-12-13 MED ORDER — FLUTICASONE-SALMETEROL 100-50 MCG/DOSE IN AEPB: 1.0000 | INHALATION_SPRAY | Freq: Two times a day (BID) | RESPIRATORY_TRACT | Status: DC

## 2013-12-13 NOTE — Telephone Encounter (Signed)
Pt aware. Nothing further needed at this time.  °

## 2013-12-13 NOTE — Telephone Encounter (Signed)
Ok if we have one. We don't get enough to keep her supplied.

## 2013-12-13 NOTE — Telephone Encounter (Signed)
Called work # provided and was told they were not aware of a Theia that worked there.  called home #- line rang several times and no answer and no VM Called mobile # and LMTCB x1  1 sample left for pick up of advair

## 2013-12-13 NOTE — Telephone Encounter (Signed)
Pt is requesting sample of advair 100-50 mcg. Please advise if okay to do so? thanks

## 2013-12-24 ENCOUNTER — Other Ambulatory Visit: Payer: Self-pay | Admitting: Internal Medicine

## 2013-12-24 ENCOUNTER — Ambulatory Visit
Admission: RE | Admit: 2013-12-24 | Discharge: 2013-12-24 | Disposition: A | Discharge status: Home / Self Care | Payer: PRIVATE HEALTH INSURANCE | Source: Ambulatory Visit | Attending: Internal Medicine | Admitting: Internal Medicine

## 2013-12-24 ENCOUNTER — Encounter (INDEPENDENT_AMBULATORY_CARE_PROVIDER_SITE_OTHER): Payer: Self-pay

## 2013-12-24 DIAGNOSIS — N63 Unspecified lump in unspecified breast: Secondary | ICD-10-CM

## 2014-01-31 ENCOUNTER — Other Ambulatory Visit: Payer: Self-pay | Admitting: Internal Medicine

## 2014-01-31 DIAGNOSIS — N63 Unspecified lump in unspecified breast: Secondary | ICD-10-CM

## 2014-02-13 ENCOUNTER — Ambulatory Visit
Admission: RE | Admit: 2014-02-13 | Discharge: 2014-02-13 | Disposition: A | Discharge status: Home / Self Care | Payer: PRIVATE HEALTH INSURANCE | Source: Ambulatory Visit | Attending: Internal Medicine | Admitting: Internal Medicine

## 2014-02-13 DIAGNOSIS — N63 Unspecified lump in unspecified breast: Secondary | ICD-10-CM

## 2014-02-13 MED ORDER — GADOBENATE DIMEGLUMINE 529 MG/ML IV SOLN
16.0000 mL | Freq: Once | INTRAVENOUS | Status: AC | PRN
  Administered 2014-02-13: 16 mL via INTRAVENOUS

## 2014-03-07 ENCOUNTER — Other Ambulatory Visit: Payer: Self-pay | Admitting: Gastroenterology

## 2014-07-08 ENCOUNTER — Telehealth: Payer: Self-pay | Admitting: Internal Medicine

## 2014-07-08 MED ORDER — MONTELUKAST SODIUM 10 MG PO TABS: 10.0000 mg | ORAL_TABLET | Freq: Every day | ORAL | Status: DC

## 2014-07-08 NOTE — Telephone Encounter (Signed)
Rx has been sent in. Pt is aware. Nothing further was needed. 

## 2014-07-10 ENCOUNTER — Telehealth: Payer: Self-pay | Admitting: Internal Medicine

## 2014-07-10 MED ORDER — MONTELUKAST SODIUM 10 MG PO TABS: 10.0000 mg | ORAL_TABLET | Freq: Every day | ORAL | Status: DC

## 2014-07-10 NOTE — Telephone Encounter (Signed)
Spoke with pt. States that her insurance company will not cover name brand anymore. Requests that we resend this prescription for generic. This has been taken care of. Nothing further was needed.

## 2014-07-22 ENCOUNTER — Ambulatory Visit (INDEPENDENT_AMBULATORY_CARE_PROVIDER_SITE_OTHER): Payer: PRIVATE HEALTH INSURANCE | Admitting: Internal Medicine

## 2014-07-22 ENCOUNTER — Encounter (INDEPENDENT_AMBULATORY_CARE_PROVIDER_SITE_OTHER): Payer: Self-pay

## 2014-07-22 VITALS — BP 118/72 | HR 71 | Ht 65.0 in | Wt 165.0 lb

## 2014-07-22 DIAGNOSIS — D869 Sarcoidosis, unspecified: Secondary | ICD-10-CM | POA: Diagnosis not present

## 2014-07-22 DIAGNOSIS — J45998 Other asthma: Secondary | ICD-10-CM | POA: Diagnosis not present

## 2014-07-22 DIAGNOSIS — J309 Allergic rhinitis, unspecified: Secondary | ICD-10-CM | POA: Diagnosis not present

## 2014-07-22 DIAGNOSIS — J302 Other seasonal allergic rhinitis: Secondary | ICD-10-CM

## 2014-07-22 DIAGNOSIS — J3089 Other allergic rhinitis: Principal | ICD-10-CM

## 2014-07-22 MED ORDER — FLUTICASONE-SALMETEROL 100-50 MCG/DOSE IN AEPB: 1.0000 | INHALATION_SPRAY | Freq: Two times a day (BID) | RESPIRATORY_TRACT | Status: DC

## 2014-07-22 MED ORDER — FLUTICASONE PROPIONATE 50 MCG/ACT NA SUSP: 2.0000 | Freq: Every day | NASAL | Status: DC

## 2014-07-22 MED ORDER — MONTELUKAST SODIUM 10 MG PO TABS: 10.0000 mg | ORAL_TABLET | Freq: Every day | ORAL | Status: DC

## 2014-07-22 MED ORDER — ALBUTEROL SULFATE HFA 108 (90 BASE) MCG/ACT IN AERS: 2.0000 | INHALATION_SPRAY | RESPIRATORY_TRACT | Status: DC | PRN

## 2014-07-22 NOTE — Progress Notes (Signed)
Subjective:    Patient ID: Joy Rojas, female    DOB: 10/18/1950, 64 y.o.   MRN: [id number]  HPI 07/10/10- 64 yoF never smoker, followed for allergic rhinitis and hx mild intermittent asthma. Complicating past hx of Sarcoid, hypertension, sleep apnea with restless legs.  No significant respiratory events in past year. Minor Spring allergy eye burning and watering- managed with eye drops. Little nasal congestion or sneeze and no wheeze or chest tightness. She continues regular use of Advair, rare need for rescue inhaler. Using Singulair- discussed.  She is pleased and doesn't want changes.   07/12/11- 64 yoF never smoker, followed for allergic rhinitis and hx mild intermittent asthma. Complicating past hx of Sarcoid, hypertension, sleep apnea with restless legs.  Started yesterday with dry hacky cough; making throat sore from cough. She did not like generic Singulair and prefers brand name. She has kept her weight off and has not needed CPAP.  07/17/12- 64 yoF never smoker, followed for allergic rhinitis and hx mild intermittent asthma. FOLLOWS FOR: denies any troubles/flare ups with allergies or asthma at this time. Breathing has been stable with no acute problems. She mentions and intermittent upper thoracic spine pain that reminds her of how she felt when she first developed sarcoid. She denies fever, cough, swollen glands, rash.  07/16/13- 63 yoF never smoker, followed for allergic rhinitis and hx mild intermittent asthma, complicated by hx sarcoid, restless legs FOLLOWS FOR: doing well over all; needs NAME BRAND Sinuglair, Fluticasone, and Advair refills-[facility]. We discussed medications. She feels brand-name Singulair works much better. Otherwise doing very well with minor bronchitis cold during the winter CXR 11/01/12 IMPRESSION:  No active disease. No significant change.  Original Report Authenticated By: Lahoma Crocker, M.D.   07/22/14- 64 yoF never smoker, followed for allergic rhinitis  and hx mild intermittent asthma, complicated by hx sarcoid, restless legs Pt. states that still having some issues but, has been able to deal with it. Never needs rescue inhaler but agrees to accept a print refill prescription. We discussed her medications and refills. No acute concern. No chest pain, fever, palpitation or sputum.  ROS-see HPI Constitutional:   No-   weight loss, night sweats, fevers, chills, fatigue, lassitude. HEENT:   No-  headaches, difficulty swallowing, tooth/dental problems, sore throat,       No-  sneezing, itching, ear ache, nasal congestion, post nasal drip,  CV:  No-   chest pain, orthopnea, PND, swelling in lower extremities, anasarca, dizziness, palpitations Resp: No-   shortness of breath with exertion or at rest.              No-   productive cough,  No- non-productive cough,  No- coughing up of blood.              No-   change in color of mucus.  No- wheezing.   Skin: No-   rash or lesions. GI:  No-   heartburn, indigestion, abdominal pain, nausea, vomiting, GU:  MS:  No-   joint pain or swelling.   Neuro-     nothing unusual Psych:  No- change in mood or affect. No depression or anxiety.  No memory loss.  OBJ- Physical Exam General- Alert, Oriented, Affect-appropriate, Distress- none acute Skin- rash-none, lesions- none, excoriation- none Lymphadenopathy- none Head- atraumatic            Eyes- Gross vision intact, PERRLA, conjunctivae and secretions clear            Ears-  Hearing, canals-normal            Nose- Clear, no-Septal dev, mucus, polyps, erosion, perforation             Throat- Mallampati II , mucosa clear , drainage- none, tonsils- atrophic Neck- flexible , trachea midline, no stridor , thyroid nl, carotid no bruit Chest - symmetrical excursion , unlabored           Heart/CV- RRR , no murmur , no gallop  , no rub, nl s1 s2                           - JVD- none , edema- none, stasis changes- none, varices- none           Lung- clear to P&A,  wheeze- none, cough- none , dullness-none, rub- none           Chest wall-  Abd-  Br/ Gen/ Rectal- Not done, not indicated Extrem- cyanosis- none, clubbing, none, atrophy- none, strength- nl Neuro- grossly intact to observation

## 2014-07-22 NOTE — Patient Instructions (Signed)
Scripts sent for med refills, with printed script for Proair rescue inhaler to hold in case needed.  Please call as needed

## 2014-07-23 ENCOUNTER — Encounter: Payer: Self-pay | Admitting: Internal Medicine

## 2014-07-23 ENCOUNTER — Telehealth: Payer: Self-pay | Admitting: Internal Medicine

## 2014-07-23 NOTE — Assessment & Plan Note (Signed)
Mild intermittent well-controlled. Plan-refill printed prescription for rescue inhaler and three-month mail order prescription for Singulair, Advair 100/50

## 2014-07-23 NOTE — Telephone Encounter (Signed)
Spoke with [place] at [employer]- states that they have a permanent comment stating that pt requests only brand name meds but we send an rx for generic substitution allowed.  I advised [place] that the brand name can be filled for pt.  Nothing further needed.

## 2014-07-23 NOTE — Assessment & Plan Note (Signed)
In long-term remission. Probably burned out.

## 2014-07-23 NOTE — Assessment & Plan Note (Signed)
We discussed under refilling Singulair and Flonase. OTC antihistamine if needed.

## 2014-10-08 ENCOUNTER — Telehealth: Payer: Self-pay | Admitting: Internal Medicine

## 2014-10-08 NOTE — Telephone Encounter (Signed)
LMTCB x 1 for pt Please schedule appt for pt when call back

## 2014-10-08 NOTE — Telephone Encounter (Signed)
Katie- see if you can find a work-in in next few days for [employer]

## 2014-10-08 NOTE — Telephone Encounter (Signed)
Pt c/o L sided mid-back pain that gives sob X2 weeks. Pt had appt scheduled for 8/11 but had to reschedule d/t work scheduling conflict.  Pt is wanting to see if there's another date that she can be worked in to be seen.    CY please advise.  Thanks!

## 2014-10-08 NOTE — Telephone Encounter (Signed)
Please have patient come in on Thursday 10-10-14 at 11:15am slot. Please make patient aware that she is being worked in and may have a slightly longer wait time than usual. Thanks.

## 2014-10-09 NOTE — Telephone Encounter (Signed)
lmomtcb x2 for pt 

## 2014-10-10 ENCOUNTER — Ambulatory Visit: Payer: PRIVATE HEALTH INSURANCE | Admitting: Internal Medicine

## 2014-10-10 NOTE — Telephone Encounter (Signed)
Joy Rojas the pt had to cancel her appt that was already scheduled for today..please advise on another day that the pt can be worked in.  Thanks!

## 2014-10-10 NOTE — Telephone Encounter (Signed)
Please have patient come in Friday 10-11-14 at 11:30 am slot. If unable to come in then she can come in on Monday pm schedule-I have two held spots. Thanks.

## 2014-10-10 NOTE — Telephone Encounter (Signed)
Called spoke with pt. She reports she knows it is her acid reflux and no longer wants a work in appt. Will sign off.

## 2014-10-14 ENCOUNTER — Other Ambulatory Visit: Payer: Self-pay

## 2014-10-14 DIAGNOSIS — Z1231 Encounter for screening mammogram for malignant neoplasm of breast: Secondary | ICD-10-CM

## 2014-11-25 ENCOUNTER — Ambulatory Visit
Admission: RE | Admit: 2014-11-25 | Discharge: 2014-11-25 | Disposition: A | Discharge status: Home / Self Care | Payer: PRIVATE HEALTH INSURANCE | Source: Ambulatory Visit

## 2014-11-25 DIAGNOSIS — Z1231 Encounter for screening mammogram for malignant neoplasm of breast: Secondary | ICD-10-CM

## 2015-04-10 ENCOUNTER — Telehealth: Payer: Self-pay | Admitting: Internal Medicine

## 2015-04-10 MED ORDER — FLUTICASONE PROPIONATE 50 MCG/ACT NA SUSP: 2.0000 | Freq: Every day | NASAL | Status: DC

## 2015-04-10 NOTE — Telephone Encounter (Signed)
Called spoke with pt. Aware RX has been sent in. Nothing further needed 

## 2015-07-14 ENCOUNTER — Encounter: Payer: Self-pay | Admitting: Internal Medicine

## 2015-07-14 ENCOUNTER — Ambulatory Visit (INDEPENDENT_AMBULATORY_CARE_PROVIDER_SITE_OTHER): Payer: PRIVATE HEALTH INSURANCE | Admitting: Internal Medicine

## 2015-07-14 VITALS — BP 124/64 | HR 84 | Ht 67.0 in | Wt 165.0 lb

## 2015-07-14 DIAGNOSIS — J309 Allergic rhinitis, unspecified: Secondary | ICD-10-CM

## 2015-07-14 DIAGNOSIS — J45998 Other asthma: Secondary | ICD-10-CM

## 2015-07-14 DIAGNOSIS — J3089 Other allergic rhinitis: Principal | ICD-10-CM

## 2015-07-14 DIAGNOSIS — J302 Other seasonal allergic rhinitis: Secondary | ICD-10-CM

## 2015-07-14 MED ORDER — ALBUTEROL SULFATE HFA 108 (90 BASE) MCG/ACT IN AERS: 2.0000 | INHALATION_SPRAY | RESPIRATORY_TRACT | Status: AC | PRN

## 2015-07-14 MED ORDER — MONTELUKAST SODIUM 10 MG PO TABS: 10.0000 mg | ORAL_TABLET | Freq: Every day | ORAL | Status: AC

## 2015-07-14 MED ORDER — FLUTICASONE-SALMETEROL 100-50 MCG/DOSE IN AEPB: 1.0000 | INHALATION_SPRAY | Freq: Two times a day (BID) | RESPIRATORY_TRACT | Status: AC

## 2015-07-14 MED ORDER — FLUTICASONE PROPIONATE 50 MCG/ACT NA SUSP: 2.0000 | Freq: Every day | NASAL | Status: DC

## 2015-07-14 NOTE — Progress Notes (Signed)
Subjective:    Patient ID: ZUREE WILKINSON, female    DOB: 12-Jan-1951, 65 y.o.   MRN: [id number]  HPI 07/10/10- 52 yoF never smoker, followed for allergic rhinitis and hx mild intermittent asthma. Complicating past hx of Sarcoid, hypertension, sleep apnea with restless legs.  No significant respiratory events in past year. Minor Spring allergy eye burning and watering- managed with eye drops. Little nasal congestion or sneeze and no wheeze or chest tightness. She continues regular use of Advair, rare need for rescue inhaler. Using Singulair- discussed.  She is pleased and doesn't want changes.   07/12/11- 59 yoF never smoker, followed for allergic rhinitis and hx mild intermittent asthma. Complicating past hx of Sarcoid, hypertension, sleep apnea with restless legs.  Started yesterday with dry hacky cough; making throat sore from cough. She did not like generic Singulair and prefers brand name. She has kept her weight off and has not needed CPAP.  07/17/12- 80 yoF never smoker, followed for allergic rhinitis and hx mild intermittent asthma. FOLLOWS FOR: denies any troubles/flare ups with allergies or asthma at this time. Breathing has been stable with no acute problems. She mentions and intermittent upper thoracic spine pain that reminds her of how she felt when she first developed sarcoid. She denies fever, cough, swollen glands, rash.  07/16/13- 89 yoF never smoker, followed for allergic rhinitis and hx mild intermittent asthma, complicated by hx sarcoid, restless legs FOLLOWS FOR: doing well over all; needs NAME BRAND Sinuglair, Fluticasone, and Advair refills-[facility]. We discussed medications. She feels brand-name Singulair works much better. Otherwise doing very well with minor bronchitis cold during the winter CXR 11/01/12 IMPRESSION:  No active disease. No significant change.  Original Report Authenticated By: Lahoma Crocker, M.D.   07/22/14- 56 yoF never smoker, followed for allergic rhinitis  and hx mild intermittent asthma, complicated by hx sarcoid, restless legs Pt. states that still having some issues but, has been able to deal with it. Never needs rescue inhaler but agrees to accept a print refill prescription. We discussed her medications and refills. No acute concern. No chest pain, fever, palpitation or sputum.  07/14/2015-65 year old female never smoker followed for allergic rhinitis, mild intermittent asthma, complicated by history sarcoid, restless legs FOLLOWS FOR: pt doing well. no concerns today. Flonase help during peak spring pollen season. She credits Singulair and Advair for stabilizing her asthma with very rare need for rescue inhaler.  ROS-see HPI Constitutional:   No-   weight loss, night sweats, fevers, chills, fatigue, lassitude. HEENT:   No-  headaches, difficulty swallowing, tooth/dental problems, sore throat,       No-  sneezing, itching, ear ache, nasal congestion, post nasal drip,  CV:  No-   chest pain, orthopnea, PND, swelling in lower extremities, anasarca, dizziness, palpitations Resp: No-   shortness of breath with exertion or at rest.              No-   productive cough,  No- non-productive cough,  No- coughing up of blood.              No-   change in color of mucus.  No- wheezing.   Skin: No-   rash or lesions. GI:  No-   heartburn, indigestion, abdominal pain, nausea, vomiting, GU:  MS:  No-   joint pain or swelling.   Neuro-     nothing unusual Psych:  No- change in mood or affect. No depression or anxiety.  No memory loss.  OBJ- Physical Exam  General- Alert, Oriented, Affect-appropriate, Distress- none acute Skin- rash-none, lesions- none, excoriation- none Lymphadenopathy- none Head- atraumatic            Eyes- Gross vision intact, PERRLA, conjunctivae and secretions clear            Ears- Hearing, canals-normal            Nose- Clear, no-Septal dev, mucus, polyps, erosion, perforation             Throat- Mallampati II , mucosa clear ,  drainage- none, tonsils- atrophic Neck- flexible , trachea midline, no stridor , thyroid nl, carotid no bruit Chest - symmetrical excursion , unlabored           Heart/CV- RRR , no murmur , no gallop  , no rub, nl s1 s2                           - JVD- none , edema- none, stasis changes- none, varices- none           Lung- clear to P&A, wheeze- none, cough- none , dullness-none, rub- none           Chest wall-  Abd-  Br/ Gen/ Rectal- Not done, not indicated Extrem- cyanosis- none, clubbing, none, atrophy- none, strength- nl Neuro- grossly intact to observation

## 2015-07-14 NOTE — Patient Instructions (Addendum)
Refill script printed for albuterol rescue inhaler  Scripts sent for Singulair, flonase and Advair

## 2015-07-15 NOTE — Assessment & Plan Note (Addendum)
Mild intermittent uncomplicated well controlled. Plan-refill Singulair and Advair, rescue inhaler She may choose to be managed by her primary provider, returning here if needed

## 2015-07-15 NOTE — Assessment & Plan Note (Signed)
Flonase and an occasional antihistamine were sufficient this year during peak spring pollen season. She is prepared to use a mask sometimes outdoors if needed.

## 2015-08-18 ENCOUNTER — Other Ambulatory Visit: Payer: Commercial Managed Care - PPO | Admitting: Physician Assistant

## 2015-08-25 ENCOUNTER — Encounter: Payer: Self-pay | Admitting: Physician Assistant

## 2015-08-25 ENCOUNTER — Ambulatory Visit (INDEPENDENT_AMBULATORY_CARE_PROVIDER_SITE_OTHER): Payer: PRIVATE HEALTH INSURANCE | Admitting: Physician Assistant

## 2015-08-25 VITALS — BP 112/64 | HR 68 | Temp 98.5°F | Resp 26 | Ht 67.0 in | Wt 171.0 lb

## 2015-08-25 DIAGNOSIS — Z01419 Encounter for gynecological examination (general) (routine) without abnormal findings: Secondary | ICD-10-CM

## 2015-08-25 DIAGNOSIS — Z124 Encounter for screening for malignant neoplasm of cervix: Secondary | ICD-10-CM | POA: Diagnosis not present

## 2015-08-25 NOTE — Progress Notes (Signed)
Patient ID: Joy Rojas MRN: [id number], DOB: [birth year], 65 y.o. Date of Encounter: 08/25/2015, 11:49 AM    Chief Complaint:  Chief Complaint  Patient presents with  . Pelvic Exam    Unsure if she needs pap     HPI: 65 y.o. year old female here for pelvic exam.    11/23/2012: She saw Dr. Harriett Rush in the past for her GYN care. Since he retired she has been seeing me for this. She goes to [place] for her routine general medical care.  She has had no abnormal GYN history. She has had no children. She has had no hysterectomy. She is on no GYN medications. On no hormone replacement therapy etc. She has no personal history of any GYN cancers. However she does have 2 sisters who have had breast cancer. No other family history of breast cancer. There is no family history of any uterine or ovarian cancers.  She has no complaints today. She just had a mammogram earlier this week and already knows that the results were normal.  08/25/2015: She is here for pelvic exam/pap smear.  Says she "has CPE scheduled with Dr. Shelia Media next week and knows he will ask about pap smear and wants to be able to tell him she has had it done or else he will want to do it himself."  Says she has mammogram every September and already has this scheduled for this upcoming September.      Home Meds:    Current Outpatient Prescriptions on File Prior to Visit  Medication Sig Dispense Refill  . albuterol (PROAIR HFA) 108 (90 Base) MCG/ACT inhaler Inhale 2 puffs into the lungs every 4 (four) hours as needed for wheezing or shortness of breath. 1 Inhaler 12  . Coenzyme Q10 (COQ10) 100 MG CAPS Take 1 capsule by mouth daily.      Marland Kitchen dexlansoprazole (DEXILANT) 60 MG capsule Take 60 mg by mouth daily.    . fluticasone (FLONASE) 50 MCG/ACT nasal spray Place 2 sprays into both nostrils daily. As directed 48 g 3  . Fluticasone-Salmeterol (ADVAIR DISKUS) 100-50 MCG/DOSE AEPB Inhale 1 puff into the lungs every 12  (twelve) hours. Rinse mouth 3 each 3  . Glucosamine-Chondroit-Vit C-Mn (GLUCOSAMINE 1500 COMPLEX) CAPS Take 1 capsule by mouth daily.      . montelukast (SINGULAIR) 10 MG tablet Take 1 tablet (10 mg total) by mouth at bedtime. 90 tablet 3  . pramipexole (MIRAPEX) 0.125 MG tablet Take 1/2 by mouth at bedtime     . valsartan-hydrochlorothiazide (DIOVAN-HCT) 80-12.5 MG per tablet Take 1 tablet by mouth daily.     No current facility-administered medications on file prior to visit.    Allergies:  Allergies  Allergen Reactions  . Codeine     REACTION: nausea  . Vancomycin     REACTION: asthma attack      Review of Systems: See HPI for pertinent ROS. All other ROS negative.    Physical Exam: Blood pressure 112/64, pulse 68, temperature 98.5 F (36.9 C), temperature source Oral, resp. rate 26, height 5\' 7"  (1.702 m), weight 171 lb (77.565 kg)., Body mass index is 26.78 kg/(m^2). General: Well-nourished well-developed female. Very pleasant. Appears in no acute distress. Neck: Supple. No thyromegaly. No lymphadenopathy. Lungs: Clear bilaterally to auscultation without wheezes, rales, or rhonchi. Breathing is unlabored. Heart: Regular rhythm. No murmurs, rubs, or gallops. Abdomen: Soft, non-tender, non-distended with normoactive bowel sounds. No hepatomegaly. No rebound/guarding. No obvious abdominal masses. Pelvic exam: External genitalia  are normal. Vaginal mucosa normal. Cervix is normal. Bimanual exam is normal with no mass and no area of tenderness. Msk:  Strength and tone normal for age. Psych:  Responds to questions appropriately with a normal affect.very pleasant .     ASSESSMENT AND PLAN:  65 y.o. year old female with    1. Encounter for cervical Pap smear with pelvic exam Pelvic Exam Normal. Send Pap - PAP, Thin Prep w/HPV rflx HPV Type 16/18  Last Pap smear was 11/09/2010. Cytology was normal. HPV was negative.  Followup GYN exam in 1 year.   Signed, [address] [place],  [place], [facility] 08/25/2015 11:49 AM

## 2015-08-27 ENCOUNTER — Encounter: Payer: Self-pay | Admitting: Family Medicine

## 2015-08-27 LAB — PAP, THIN PREP W/HPV RFLX HPV TYPE 16/18: HPV DNA High Risk: NOT DETECTED

## 2015-10-28 ENCOUNTER — Other Ambulatory Visit: Payer: Self-pay | Admitting: Internal Medicine

## 2015-10-28 DIAGNOSIS — Z1231 Encounter for screening mammogram for malignant neoplasm of breast: Secondary | ICD-10-CM

## 2015-12-02 ENCOUNTER — Ambulatory Visit
Admission: RE | Admit: 2015-12-02 | Discharge: 2015-12-02 | Disposition: A | Discharge status: Home / Self Care | Payer: PRIVATE HEALTH INSURANCE | Source: Ambulatory Visit | Attending: Internal Medicine | Admitting: Internal Medicine

## 2015-12-02 DIAGNOSIS — Z1231 Encounter for screening mammogram for malignant neoplasm of breast: Secondary | ICD-10-CM

## 2016-08-26 ENCOUNTER — Encounter: Payer: Managed Care, Other (non HMO) | Admitting: Physician Assistant

## 2016-09-15 ENCOUNTER — Ambulatory Visit: Payer: PRIVATE HEALTH INSURANCE | Admitting: Internal Medicine

## 2016-09-22 ENCOUNTER — Ambulatory Visit: Payer: PRIVATE HEALTH INSURANCE | Admitting: Internal Medicine

## 2016-10-07 ENCOUNTER — Encounter: Payer: Managed Care, Other (non HMO) | Admitting: Physician Assistant

## 2016-10-26 ENCOUNTER — Other Ambulatory Visit: Payer: Self-pay | Admitting: Internal Medicine

## 2016-10-26 DIAGNOSIS — Z1231 Encounter for screening mammogram for malignant neoplasm of breast: Secondary | ICD-10-CM

## 2016-11-17 ENCOUNTER — Other Ambulatory Visit: Payer: Self-pay | Admitting: Internal Medicine

## 2016-12-08 ENCOUNTER — Ambulatory Visit
Admission: RE | Admit: 2016-12-08 | Discharge: 2016-12-08 | Disposition: A | Discharge status: Home / Self Care | Payer: PRIVATE HEALTH INSURANCE | Source: Ambulatory Visit | Attending: Internal Medicine | Admitting: Internal Medicine

## 2016-12-08 DIAGNOSIS — Z1231 Encounter for screening mammogram for malignant neoplasm of breast: Secondary | ICD-10-CM

## 2016-12-26 ENCOUNTER — Other Ambulatory Visit: Payer: Self-pay | Admitting: Internal Medicine

## 2017-03-15 ENCOUNTER — Other Ambulatory Visit: Payer: Self-pay | Admitting: Internal Medicine

## 2017-10-26 ENCOUNTER — Other Ambulatory Visit: Payer: Self-pay | Admitting: Internal Medicine

## 2017-10-26 DIAGNOSIS — Z1231 Encounter for screening mammogram for malignant neoplasm of breast: Secondary | ICD-10-CM

## 2017-11-30 ENCOUNTER — Encounter: Payer: Self-pay | Admitting: Physician Assistant

## 2017-11-30 ENCOUNTER — Ambulatory Visit (INDEPENDENT_AMBULATORY_CARE_PROVIDER_SITE_OTHER): Payer: PRIVATE HEALTH INSURANCE | Admitting: Physician Assistant

## 2017-11-30 VITALS — BP 116/64 | HR 68 | Temp 98.2°F | Resp 16 | Ht 65.0 in | Wt 174.0 lb

## 2017-11-30 DIAGNOSIS — Z01419 Encounter for gynecological examination (general) (routine) without abnormal findings: Secondary | ICD-10-CM | POA: Diagnosis not present

## 2017-11-30 NOTE — Progress Notes (Signed)
Patient ID: Joy Rojas MRN: [id number], DOB: [birth year], 67 y.o. Date of Encounter: 11/30/2017, 8:49 AM    Chief Complaint:  Chief Complaint  Patient presents with  . Gynecologic Exam     HPI: 67 y.o. year old female here for pelvic exam.    11/23/2012: She saw Dr. Harriett Rush in the past for her GYN care. Since he retired she has been seeing me for this. She goes to [place] for her routine general medical care.  She has had no abnormal GYN history. She has had no children. She has had no hysterectomy. She is on no GYN medications. On no hormone replacement therapy etc. She has no personal history of any GYN cancers. However she does have 2 sisters who have had breast cancer. No other family history of breast cancer. There is no family history of any uterine or ovarian cancers.  She has no complaints today. She just had a mammogram earlier this week and already knows that the results were normal.  08/25/2015: She is here for pelvic exam/pap smear.  Says she "has CPE scheduled with Dr. Shelia Media next week and knows he will ask about pap smear and wants to be able to tell him she has had it done or else he will want to do it himself."  Says she has mammogram every September and already has this scheduled for this upcoming September.      Home Meds:    Current Outpatient Medications on File Prior to Visit  Medication Sig Dispense Refill  . albuterol (PROAIR HFA) 108 (90 Base) MCG/ACT inhaler Inhale 2 puffs into the lungs every 4 (four) hours as needed for wheezing or shortness of breath. 1 Inhaler 12  . Coenzyme Q10 (COQ10) 100 MG CAPS Take 1 capsule by mouth daily.      Marland Kitchen dexlansoprazole (DEXILANT) 60 MG capsule Take 60 mg by mouth daily.    . fluticasone (FLONASE) 50 MCG/ACT nasal spray USE 2 SPRAYS IN EACH NOSTRIL ONCE DAILY 48 g 1  . Fluticasone-Salmeterol (ADVAIR DISKUS) 100-50 MCG/DOSE AEPB Inhale 1 puff into the lungs every 12 (twelve) hours. Rinse mouth 3 each 3  .  montelukast (SINGULAIR) 10 MG tablet Take 1 tablet (10 mg total) by mouth at bedtime. 90 tablet 3  . olmesartan-hydrochlorothiazide (BENICAR HCT) 40-12.5 MG tablet Take 1 tablet by mouth daily.  3  . pramipexole (MIRAPEX) 0.125 MG tablet Take 1/2 by mouth at bedtime      No current facility-administered medications on file prior to visit.     Allergies:  Allergies  Allergen Reactions  . Codeine     REACTION: nausea  . Vancomycin     REACTION: asthma attack      Review of Systems: See HPI for pertinent ROS. All other ROS negative.    Physical Exam: Blood pressure 116/64, pulse 68, temperature 98.2 F (36.8 C), temperature source Oral, resp. rate 16, height 5\' 5"  (1.651 m), weight 78.9 kg, SpO2 98 %., Body mass index is 28.96 kg/m. General:  WNWD Female. Appears in no acute distress. Neck: Supple. No thyromegaly. No lymphadenopathy. Lungs: Clear bilaterally to auscultation without wheezes, rales, or rhonchi. Breathing is unlabored. Heart: RRR with S1 S2. No murmurs, rubs, or gallops. Musculoskeletal:  Strength and tone normal for age. Pelvic Exam: External genitalia normal.  Vaginal mucosa normal.  Cervix appears normal.  Bimanual exam is normal with no adnexal mass. Extremities/Skin: Warm and dry. Neuro: Alert and oriented X 3. Moves all extremities spontaneously. Gait  is normal. CNII-XII grossly in tact. Psych:  Responds to questions appropriately with a normal affect.     ASSESSMENT AND PLAN:  67 y.o. year old female with    1. Encounter for cervical Pap smear with pelvic exam Pelvic Exam Normal.   Last Pap smear was 08/25/2015. Cytology was normal. HPV was negative. Therefore does not need repeat Pap smear today.  Per Guidelines-- wait 5 3 to 5 years to repeat. Given that she is now age 67 she does not need any further routine Pap smears.  Follow up prn.   Marin Olp [place], [place], [facility] 11/30/2017 8:49 AM

## 2017-12-09 ENCOUNTER — Ambulatory Visit
Admission: RE | Admit: 2017-12-09 | Discharge: 2017-12-09 | Disposition: A | Discharge status: Home / Self Care | Payer: PRIVATE HEALTH INSURANCE | Source: Ambulatory Visit | Attending: Internal Medicine | Admitting: Internal Medicine

## 2017-12-09 DIAGNOSIS — Z1231 Encounter for screening mammogram for malignant neoplasm of breast: Secondary | ICD-10-CM

## 2018-06-28 ENCOUNTER — Other Ambulatory Visit: Payer: Self-pay | Admitting: *Deleted

## 2018-06-28 NOTE — Patient Outreach (Signed)
[facility]) Care Management  06/28/2018  Joy Rojas 1950-09-14 [id number]   RN Health coach sent a welcome letter. As a [employer] health plan.  Plan: Next follow up outreach within the month of June  Franchot Pollitt [place] Care Management [phone]

## 2018-08-15 ENCOUNTER — Ambulatory Visit: Payer: Self-pay | Admitting: *Deleted

## 2018-08-21 ENCOUNTER — Other Ambulatory Visit: Payer: Self-pay | Admitting: *Deleted

## 2018-08-21 NOTE — Patient Outreach (Signed)
[facility] [facility]) Care Management  08/21/2018  LATRAVIA SOUTHGATE April 03, 1950 [id number]   RN Health Coach is closing this program. Consumer is enrolled in [place] [facility] external program.  [place] Care Management [phone]

## 2018-08-31 DIAGNOSIS — I10 Essential (primary) hypertension: Secondary | ICD-10-CM | POA: Diagnosis not present

## 2018-08-31 DIAGNOSIS — R51 Headache: Secondary | ICD-10-CM | POA: Diagnosis not present

## 2018-10-04 DIAGNOSIS — M1711 Unilateral primary osteoarthritis, right knee: Secondary | ICD-10-CM | POA: Diagnosis not present

## 2018-10-04 DIAGNOSIS — M25562 Pain in left knee: Secondary | ICD-10-CM | POA: Diagnosis not present

## 2018-10-04 DIAGNOSIS — M25561 Pain in right knee: Secondary | ICD-10-CM | POA: Diagnosis not present

## 2018-10-04 DIAGNOSIS — M1712 Unilateral primary osteoarthritis, left knee: Secondary | ICD-10-CM | POA: Diagnosis not present

## 2018-10-04 DIAGNOSIS — M170 Bilateral primary osteoarthritis of knee: Secondary | ICD-10-CM | POA: Diagnosis not present

## 2018-11-15 DIAGNOSIS — M1711 Unilateral primary osteoarthritis, right knee: Secondary | ICD-10-CM | POA: Diagnosis not present

## 2018-11-15 DIAGNOSIS — M25561 Pain in right knee: Secondary | ICD-10-CM | POA: Diagnosis not present

## 2018-12-11 ENCOUNTER — Other Ambulatory Visit: Payer: Self-pay | Admitting: Internal Medicine

## 2018-12-11 DIAGNOSIS — Z1231 Encounter for screening mammogram for malignant neoplasm of breast: Secondary | ICD-10-CM

## 2018-12-13 ENCOUNTER — Ambulatory Visit
Admission: RE | Admit: 2018-12-13 | Discharge: 2018-12-13 | Disposition: A | Discharge status: Home / Self Care | Payer: PPO | Source: Ambulatory Visit | Attending: Internal Medicine | Admitting: Internal Medicine

## 2018-12-13 ENCOUNTER — Other Ambulatory Visit: Payer: Self-pay

## 2018-12-13 DIAGNOSIS — Z1231 Encounter for screening mammogram for malignant neoplasm of breast: Secondary | ICD-10-CM | POA: Diagnosis not present

## 2018-12-15 ENCOUNTER — Other Ambulatory Visit: Payer: Self-pay | Admitting: Internal Medicine

## 2018-12-15 DIAGNOSIS — R928 Other abnormal and inconclusive findings on diagnostic imaging of breast: Secondary | ICD-10-CM

## 2018-12-19 ENCOUNTER — Ambulatory Visit
Admission: RE | Admit: 2018-12-19 | Discharge: 2018-12-19 | Disposition: A | Discharge status: Home / Self Care | Payer: PPO | Source: Ambulatory Visit | Attending: Internal Medicine | Admitting: Internal Medicine

## 2018-12-19 ENCOUNTER — Ambulatory Visit: Payer: PRIVATE HEALTH INSURANCE

## 2018-12-19 ENCOUNTER — Other Ambulatory Visit: Payer: Self-pay

## 2018-12-19 DIAGNOSIS — R928 Other abnormal and inconclusive findings on diagnostic imaging of breast: Secondary | ICD-10-CM

## 2018-12-20 ENCOUNTER — Other Ambulatory Visit: Payer: PRIVATE HEALTH INSURANCE

## 2019-01-10 DIAGNOSIS — I10 Essential (primary) hypertension: Secondary | ICD-10-CM | POA: Diagnosis not present

## 2019-01-10 DIAGNOSIS — Z0000 Encounter for general adult medical examination without abnormal findings: Secondary | ICD-10-CM | POA: Diagnosis not present

## 2019-01-10 DIAGNOSIS — E7800 Pure hypercholesterolemia, unspecified: Secondary | ICD-10-CM | POA: Diagnosis not present

## 2019-01-10 DIAGNOSIS — E559 Vitamin D deficiency, unspecified: Secondary | ICD-10-CM | POA: Diagnosis not present

## 2019-01-17 DIAGNOSIS — I10 Essential (primary) hypertension: Secondary | ICD-10-CM | POA: Diagnosis not present

## 2019-01-17 DIAGNOSIS — Z0000 Encounter for general adult medical examination without abnormal findings: Secondary | ICD-10-CM | POA: Diagnosis not present

## 2019-01-17 DIAGNOSIS — Z23 Encounter for immunization: Secondary | ICD-10-CM | POA: Diagnosis not present

## 2019-04-16 ENCOUNTER — Ambulatory Visit: Payer: PRIVATE HEALTH INSURANCE

## 2019-05-14 DIAGNOSIS — Z01419 Encounter for gynecological examination (general) (routine) without abnormal findings: Secondary | ICD-10-CM | POA: Diagnosis not present

## 2019-05-14 DIAGNOSIS — Z13820 Encounter for screening for osteoporosis: Secondary | ICD-10-CM | POA: Diagnosis not present

## 2019-05-14 DIAGNOSIS — N9089 Other specified noninflammatory disorders of vulva and perineum: Secondary | ICD-10-CM | POA: Diagnosis not present

## 2019-05-17 ENCOUNTER — Other Ambulatory Visit: Payer: Self-pay | Admitting: Nurse Practitioner

## 2019-05-17 DIAGNOSIS — R5381 Other malaise: Secondary | ICD-10-CM

## 2019-08-16 ENCOUNTER — Other Ambulatory Visit: Payer: Self-pay | Admitting: Nurse Practitioner

## 2019-08-16 DIAGNOSIS — Z780 Asymptomatic menopausal state: Secondary | ICD-10-CM

## 2019-08-20 ENCOUNTER — Ambulatory Visit
Admission: RE | Admit: 2019-08-20 | Discharge: 2019-08-20 | Disposition: A | Discharge status: Home / Self Care | Payer: PPO | Source: Ambulatory Visit | Attending: Nurse Practitioner | Admitting: Nurse Practitioner

## 2019-08-20 ENCOUNTER — Other Ambulatory Visit: Payer: Self-pay

## 2019-08-20 DIAGNOSIS — M85852 Other specified disorders of bone density and structure, left thigh: Secondary | ICD-10-CM | POA: Diagnosis not present

## 2019-08-20 DIAGNOSIS — Z780 Asymptomatic menopausal state: Secondary | ICD-10-CM | POA: Diagnosis not present

## 2019-09-25 ENCOUNTER — Other Ambulatory Visit: Payer: Self-pay

## 2019-11-20 ENCOUNTER — Other Ambulatory Visit: Payer: Self-pay | Admitting: Internal Medicine

## 2019-11-20 DIAGNOSIS — Z1231 Encounter for screening mammogram for malignant neoplasm of breast: Secondary | ICD-10-CM

## 2019-12-18 ENCOUNTER — Ambulatory Visit: Payer: PPO

## 2020-01-16 ENCOUNTER — Ambulatory Visit
Admission: RE | Admit: 2020-01-16 | Discharge: 2020-01-16 | Disposition: A | Discharge status: Home / Self Care | Payer: PPO | Source: Ambulatory Visit | Attending: Internal Medicine | Admitting: Internal Medicine

## 2020-01-16 ENCOUNTER — Other Ambulatory Visit: Payer: Self-pay

## 2020-01-16 DIAGNOSIS — Z1231 Encounter for screening mammogram for malignant neoplasm of breast: Secondary | ICD-10-CM

## 2020-04-23 DIAGNOSIS — Z0000 Encounter for general adult medical examination without abnormal findings: Secondary | ICD-10-CM | POA: Diagnosis not present

## 2020-04-23 DIAGNOSIS — I10 Essential (primary) hypertension: Secondary | ICD-10-CM | POA: Diagnosis not present

## 2020-04-23 DIAGNOSIS — E559 Vitamin D deficiency, unspecified: Secondary | ICD-10-CM | POA: Diagnosis not present

## 2020-04-30 ENCOUNTER — Other Ambulatory Visit: Payer: Self-pay | Admitting: Internal Medicine

## 2020-04-30 DIAGNOSIS — E7800 Pure hypercholesterolemia, unspecified: Secondary | ICD-10-CM

## 2020-04-30 DIAGNOSIS — E559 Vitamin D deficiency, unspecified: Secondary | ICD-10-CM | POA: Diagnosis not present

## 2020-04-30 DIAGNOSIS — K219 Gastro-esophageal reflux disease without esophagitis: Secondary | ICD-10-CM | POA: Diagnosis not present

## 2020-04-30 DIAGNOSIS — G2581 Restless legs syndrome: Secondary | ICD-10-CM | POA: Diagnosis not present

## 2020-04-30 DIAGNOSIS — Z23 Encounter for immunization: Secondary | ICD-10-CM | POA: Diagnosis not present

## 2020-04-30 DIAGNOSIS — J45909 Unspecified asthma, uncomplicated: Secondary | ICD-10-CM | POA: Diagnosis not present

## 2020-04-30 DIAGNOSIS — I10 Essential (primary) hypertension: Secondary | ICD-10-CM | POA: Diagnosis not present

## 2020-04-30 DIAGNOSIS — Z0000 Encounter for general adult medical examination without abnormal findings: Secondary | ICD-10-CM | POA: Diagnosis not present

## 2020-05-15 ENCOUNTER — Other Ambulatory Visit: Payer: PPO

## 2020-05-27 ENCOUNTER — Other Ambulatory Visit: Payer: Medicare HMO

## 2020-07-14 DIAGNOSIS — M1711 Unilateral primary osteoarthritis, right knee: Secondary | ICD-10-CM | POA: Diagnosis not present

## 2020-07-14 DIAGNOSIS — M2241 Chondromalacia patellae, right knee: Secondary | ICD-10-CM | POA: Diagnosis not present

## 2020-07-30 DIAGNOSIS — Z124 Encounter for screening for malignant neoplasm of cervix: Secondary | ICD-10-CM | POA: Diagnosis not present

## 2020-07-30 DIAGNOSIS — Z01419 Encounter for gynecological examination (general) (routine) without abnormal findings: Secondary | ICD-10-CM | POA: Diagnosis not present

## 2020-08-18 DIAGNOSIS — M1712 Unilateral primary osteoarthritis, left knee: Secondary | ICD-10-CM | POA: Diagnosis not present

## 2020-08-18 DIAGNOSIS — M25562 Pain in left knee: Secondary | ICD-10-CM | POA: Diagnosis not present

## 2020-08-18 DIAGNOSIS — Z881 Allergy status to other antibiotic agents status: Secondary | ICD-10-CM | POA: Diagnosis not present

## 2020-08-18 DIAGNOSIS — Z79899 Other long term (current) drug therapy: Secondary | ICD-10-CM | POA: Diagnosis not present

## 2020-08-18 DIAGNOSIS — Z888 Allergy status to other drugs, medicaments and biological substances status: Secondary | ICD-10-CM | POA: Diagnosis not present

## 2020-10-20 DIAGNOSIS — R7989 Other specified abnormal findings of blood chemistry: Secondary | ICD-10-CM | POA: Diagnosis not present

## 2020-10-20 DIAGNOSIS — M1711 Unilateral primary osteoarthritis, right knee: Secondary | ICD-10-CM | POA: Diagnosis not present

## 2020-10-29 DIAGNOSIS — G8929 Other chronic pain: Secondary | ICD-10-CM | POA: Diagnosis not present

## 2020-10-29 DIAGNOSIS — M1711 Unilateral primary osteoarthritis, right knee: Secondary | ICD-10-CM | POA: Diagnosis not present

## 2020-10-29 DIAGNOSIS — Z789 Other specified health status: Secondary | ICD-10-CM | POA: Diagnosis not present

## 2020-10-29 DIAGNOSIS — M25561 Pain in right knee: Secondary | ICD-10-CM | POA: Diagnosis not present

## 2020-10-29 DIAGNOSIS — Z7409 Other reduced mobility: Secondary | ICD-10-CM | POA: Diagnosis not present

## 2020-11-13 DIAGNOSIS — G8918 Other acute postprocedural pain: Secondary | ICD-10-CM | POA: Diagnosis not present

## 2020-11-13 DIAGNOSIS — Z96651 Presence of right artificial knee joint: Secondary | ICD-10-CM | POA: Diagnosis not present

## 2020-11-13 DIAGNOSIS — Z471 Aftercare following joint replacement surgery: Secondary | ICD-10-CM | POA: Diagnosis not present

## 2020-11-13 DIAGNOSIS — G2581 Restless legs syndrome: Secondary | ICD-10-CM | POA: Diagnosis not present

## 2020-11-13 DIAGNOSIS — M1711 Unilateral primary osteoarthritis, right knee: Secondary | ICD-10-CM | POA: Diagnosis not present

## 2020-11-13 DIAGNOSIS — J45909 Unspecified asthma, uncomplicated: Secondary | ICD-10-CM | POA: Diagnosis not present

## 2020-11-13 DIAGNOSIS — I10 Essential (primary) hypertension: Secondary | ICD-10-CM | POA: Diagnosis not present

## 2020-11-13 DIAGNOSIS — E785 Hyperlipidemia, unspecified: Secondary | ICD-10-CM | POA: Diagnosis not present

## 2020-11-13 DIAGNOSIS — Z8709 Personal history of other diseases of the respiratory system: Secondary | ICD-10-CM | POA: Diagnosis not present

## 2020-11-13 DIAGNOSIS — M25761 Osteophyte, right knee: Secondary | ICD-10-CM | POA: Diagnosis not present

## 2020-11-13 DIAGNOSIS — Z79899 Other long term (current) drug therapy: Secondary | ICD-10-CM | POA: Diagnosis not present

## 2020-11-14 DIAGNOSIS — M1711 Unilateral primary osteoarthritis, right knee: Secondary | ICD-10-CM | POA: Diagnosis not present

## 2020-11-14 DIAGNOSIS — G2581 Restless legs syndrome: Secondary | ICD-10-CM | POA: Diagnosis not present

## 2020-11-14 DIAGNOSIS — J45909 Unspecified asthma, uncomplicated: Secondary | ICD-10-CM | POA: Diagnosis not present

## 2020-11-14 DIAGNOSIS — E785 Hyperlipidemia, unspecified: Secondary | ICD-10-CM | POA: Diagnosis not present

## 2020-11-14 DIAGNOSIS — Z8709 Personal history of other diseases of the respiratory system: Secondary | ICD-10-CM | POA: Diagnosis not present

## 2020-11-14 DIAGNOSIS — I10 Essential (primary) hypertension: Secondary | ICD-10-CM | POA: Diagnosis not present

## 2020-11-14 DIAGNOSIS — Z79899 Other long term (current) drug therapy: Secondary | ICD-10-CM | POA: Diagnosis not present

## 2020-11-17 DIAGNOSIS — Z7409 Other reduced mobility: Secondary | ICD-10-CM | POA: Diagnosis not present

## 2020-11-17 DIAGNOSIS — Z789 Other specified health status: Secondary | ICD-10-CM | POA: Diagnosis not present

## 2020-11-17 DIAGNOSIS — M25561 Pain in right knee: Secondary | ICD-10-CM | POA: Diagnosis not present

## 2020-11-17 DIAGNOSIS — G8929 Other chronic pain: Secondary | ICD-10-CM | POA: Diagnosis not present

## 2020-11-17 DIAGNOSIS — M1711 Unilateral primary osteoarthritis, right knee: Secondary | ICD-10-CM | POA: Diagnosis not present

## 2020-11-19 DIAGNOSIS — M25561 Pain in right knee: Secondary | ICD-10-CM | POA: Diagnosis not present

## 2020-11-19 DIAGNOSIS — Z789 Other specified health status: Secondary | ICD-10-CM | POA: Diagnosis not present

## 2020-11-19 DIAGNOSIS — M1711 Unilateral primary osteoarthritis, right knee: Secondary | ICD-10-CM | POA: Diagnosis not present

## 2020-11-19 DIAGNOSIS — Z7409 Other reduced mobility: Secondary | ICD-10-CM | POA: Diagnosis not present

## 2020-11-19 DIAGNOSIS — G8929 Other chronic pain: Secondary | ICD-10-CM | POA: Diagnosis not present

## 2020-11-24 DIAGNOSIS — G8929 Other chronic pain: Secondary | ICD-10-CM | POA: Diagnosis not present

## 2020-11-24 DIAGNOSIS — M25561 Pain in right knee: Secondary | ICD-10-CM | POA: Diagnosis not present

## 2020-11-24 DIAGNOSIS — Z7409 Other reduced mobility: Secondary | ICD-10-CM | POA: Diagnosis not present

## 2020-11-24 DIAGNOSIS — M1711 Unilateral primary osteoarthritis, right knee: Secondary | ICD-10-CM | POA: Diagnosis not present

## 2020-11-24 DIAGNOSIS — Z789 Other specified health status: Secondary | ICD-10-CM | POA: Diagnosis not present

## 2020-11-25 DIAGNOSIS — Z471 Aftercare following joint replacement surgery: Secondary | ICD-10-CM | POA: Diagnosis not present

## 2020-11-25 DIAGNOSIS — Z96651 Presence of right artificial knee joint: Secondary | ICD-10-CM | POA: Diagnosis not present

## 2020-11-26 DIAGNOSIS — M1711 Unilateral primary osteoarthritis, right knee: Secondary | ICD-10-CM | POA: Diagnosis not present

## 2020-12-01 DIAGNOSIS — G8929 Other chronic pain: Secondary | ICD-10-CM | POA: Diagnosis not present

## 2020-12-01 DIAGNOSIS — Z7409 Other reduced mobility: Secondary | ICD-10-CM | POA: Diagnosis not present

## 2020-12-01 DIAGNOSIS — M1711 Unilateral primary osteoarthritis, right knee: Secondary | ICD-10-CM | POA: Diagnosis not present

## 2020-12-01 DIAGNOSIS — Z789 Other specified health status: Secondary | ICD-10-CM | POA: Diagnosis not present

## 2020-12-01 DIAGNOSIS — M25561 Pain in right knee: Secondary | ICD-10-CM | POA: Diagnosis not present

## 2020-12-02 ENCOUNTER — Other Ambulatory Visit: Payer: Self-pay | Admitting: Internal Medicine

## 2020-12-02 DIAGNOSIS — Z1231 Encounter for screening mammogram for malignant neoplasm of breast: Secondary | ICD-10-CM

## 2020-12-03 DIAGNOSIS — M1711 Unilateral primary osteoarthritis, right knee: Secondary | ICD-10-CM | POA: Diagnosis not present

## 2020-12-03 DIAGNOSIS — M25561 Pain in right knee: Secondary | ICD-10-CM | POA: Diagnosis not present

## 2020-12-03 DIAGNOSIS — Z7409 Other reduced mobility: Secondary | ICD-10-CM | POA: Diagnosis not present

## 2020-12-03 DIAGNOSIS — Z789 Other specified health status: Secondary | ICD-10-CM | POA: Diagnosis not present

## 2020-12-03 DIAGNOSIS — G8929 Other chronic pain: Secondary | ICD-10-CM | POA: Diagnosis not present

## 2020-12-04 DIAGNOSIS — Z1231 Encounter for screening mammogram for malignant neoplasm of breast: Secondary | ICD-10-CM

## 2020-12-08 DIAGNOSIS — M25561 Pain in right knee: Secondary | ICD-10-CM | POA: Diagnosis not present

## 2020-12-08 DIAGNOSIS — M1711 Unilateral primary osteoarthritis, right knee: Secondary | ICD-10-CM | POA: Diagnosis not present

## 2020-12-08 DIAGNOSIS — G8929 Other chronic pain: Secondary | ICD-10-CM | POA: Diagnosis not present

## 2020-12-08 DIAGNOSIS — Z7409 Other reduced mobility: Secondary | ICD-10-CM | POA: Diagnosis not present

## 2020-12-08 DIAGNOSIS — Z789 Other specified health status: Secondary | ICD-10-CM | POA: Diagnosis not present

## 2020-12-10 DIAGNOSIS — Z7409 Other reduced mobility: Secondary | ICD-10-CM | POA: Diagnosis not present

## 2020-12-10 DIAGNOSIS — G8929 Other chronic pain: Secondary | ICD-10-CM | POA: Diagnosis not present

## 2020-12-10 DIAGNOSIS — Z789 Other specified health status: Secondary | ICD-10-CM | POA: Diagnosis not present

## 2020-12-10 DIAGNOSIS — M25561 Pain in right knee: Secondary | ICD-10-CM | POA: Diagnosis not present

## 2020-12-10 DIAGNOSIS — M1711 Unilateral primary osteoarthritis, right knee: Secondary | ICD-10-CM | POA: Diagnosis not present

## 2020-12-15 DIAGNOSIS — Z7409 Other reduced mobility: Secondary | ICD-10-CM | POA: Diagnosis not present

## 2020-12-15 DIAGNOSIS — M25561 Pain in right knee: Secondary | ICD-10-CM | POA: Diagnosis not present

## 2020-12-15 DIAGNOSIS — Z789 Other specified health status: Secondary | ICD-10-CM | POA: Diagnosis not present

## 2020-12-15 DIAGNOSIS — M1711 Unilateral primary osteoarthritis, right knee: Secondary | ICD-10-CM | POA: Diagnosis not present

## 2020-12-15 DIAGNOSIS — G8929 Other chronic pain: Secondary | ICD-10-CM | POA: Diagnosis not present

## 2020-12-17 DIAGNOSIS — G8929 Other chronic pain: Secondary | ICD-10-CM | POA: Diagnosis not present

## 2020-12-17 DIAGNOSIS — M25561 Pain in right knee: Secondary | ICD-10-CM | POA: Diagnosis not present

## 2020-12-17 DIAGNOSIS — Z789 Other specified health status: Secondary | ICD-10-CM | POA: Diagnosis not present

## 2020-12-17 DIAGNOSIS — M1711 Unilateral primary osteoarthritis, right knee: Secondary | ICD-10-CM | POA: Diagnosis not present

## 2020-12-17 DIAGNOSIS — Z7409 Other reduced mobility: Secondary | ICD-10-CM | POA: Diagnosis not present

## 2020-12-22 DIAGNOSIS — Z789 Other specified health status: Secondary | ICD-10-CM | POA: Diagnosis not present

## 2020-12-22 DIAGNOSIS — Z7409 Other reduced mobility: Secondary | ICD-10-CM | POA: Diagnosis not present

## 2020-12-22 DIAGNOSIS — G8929 Other chronic pain: Secondary | ICD-10-CM | POA: Diagnosis not present

## 2020-12-22 DIAGNOSIS — M25561 Pain in right knee: Secondary | ICD-10-CM | POA: Diagnosis not present

## 2020-12-22 DIAGNOSIS — M1711 Unilateral primary osteoarthritis, right knee: Secondary | ICD-10-CM | POA: Diagnosis not present

## 2020-12-24 DIAGNOSIS — M1711 Unilateral primary osteoarthritis, right knee: Secondary | ICD-10-CM | POA: Diagnosis not present

## 2020-12-24 DIAGNOSIS — Z7409 Other reduced mobility: Secondary | ICD-10-CM | POA: Diagnosis not present

## 2020-12-24 DIAGNOSIS — G8929 Other chronic pain: Secondary | ICD-10-CM | POA: Diagnosis not present

## 2020-12-24 DIAGNOSIS — Z789 Other specified health status: Secondary | ICD-10-CM | POA: Diagnosis not present

## 2020-12-24 DIAGNOSIS — M25561 Pain in right knee: Secondary | ICD-10-CM | POA: Diagnosis not present

## 2020-12-29 DIAGNOSIS — M7651 Patellar tendinitis, right knee: Secondary | ICD-10-CM | POA: Diagnosis not present

## 2020-12-29 DIAGNOSIS — Z96651 Presence of right artificial knee joint: Secondary | ICD-10-CM | POA: Diagnosis not present

## 2020-12-29 DIAGNOSIS — Z471 Aftercare following joint replacement surgery: Secondary | ICD-10-CM | POA: Diagnosis not present

## 2020-12-31 DIAGNOSIS — M25561 Pain in right knee: Secondary | ICD-10-CM | POA: Diagnosis not present

## 2020-12-31 DIAGNOSIS — G8929 Other chronic pain: Secondary | ICD-10-CM | POA: Diagnosis not present

## 2020-12-31 DIAGNOSIS — Z7409 Other reduced mobility: Secondary | ICD-10-CM | POA: Diagnosis not present

## 2020-12-31 DIAGNOSIS — M1711 Unilateral primary osteoarthritis, right knee: Secondary | ICD-10-CM | POA: Diagnosis not present

## 2020-12-31 DIAGNOSIS — Z789 Other specified health status: Secondary | ICD-10-CM | POA: Diagnosis not present

## 2021-01-07 DIAGNOSIS — Z7409 Other reduced mobility: Secondary | ICD-10-CM | POA: Diagnosis not present

## 2021-01-07 DIAGNOSIS — Z789 Other specified health status: Secondary | ICD-10-CM | POA: Diagnosis not present

## 2021-01-07 DIAGNOSIS — M25561 Pain in right knee: Secondary | ICD-10-CM | POA: Diagnosis not present

## 2021-01-07 DIAGNOSIS — M1711 Unilateral primary osteoarthritis, right knee: Secondary | ICD-10-CM | POA: Diagnosis not present

## 2021-01-07 DIAGNOSIS — G8929 Other chronic pain: Secondary | ICD-10-CM | POA: Diagnosis not present

## 2021-01-14 DIAGNOSIS — M1711 Unilateral primary osteoarthritis, right knee: Secondary | ICD-10-CM | POA: Diagnosis not present

## 2021-01-14 DIAGNOSIS — Z7409 Other reduced mobility: Secondary | ICD-10-CM | POA: Diagnosis not present

## 2021-01-14 DIAGNOSIS — M25561 Pain in right knee: Secondary | ICD-10-CM | POA: Diagnosis not present

## 2021-01-14 DIAGNOSIS — Z789 Other specified health status: Secondary | ICD-10-CM | POA: Diagnosis not present

## 2021-01-14 DIAGNOSIS — G8929 Other chronic pain: Secondary | ICD-10-CM | POA: Diagnosis not present

## 2021-01-19 ENCOUNTER — Ambulatory Visit
Admission: RE | Admit: 2021-01-19 | Discharge: 2021-01-19 | Disposition: A | Discharge status: Home / Self Care | Payer: Medicare HMO | Source: Ambulatory Visit

## 2021-01-19 DIAGNOSIS — Z1231 Encounter for screening mammogram for malignant neoplasm of breast: Secondary | ICD-10-CM

## 2021-01-27 DIAGNOSIS — Z7409 Other reduced mobility: Secondary | ICD-10-CM | POA: Diagnosis not present

## 2021-01-27 DIAGNOSIS — G8929 Other chronic pain: Secondary | ICD-10-CM | POA: Diagnosis not present

## 2021-01-27 DIAGNOSIS — Z789 Other specified health status: Secondary | ICD-10-CM | POA: Diagnosis not present

## 2021-01-27 DIAGNOSIS — M1711 Unilateral primary osteoarthritis, right knee: Secondary | ICD-10-CM | POA: Diagnosis not present

## 2021-01-27 DIAGNOSIS — M25561 Pain in right knee: Secondary | ICD-10-CM | POA: Diagnosis not present

## 2021-02-02 DIAGNOSIS — G8929 Other chronic pain: Secondary | ICD-10-CM | POA: Diagnosis not present

## 2021-02-02 DIAGNOSIS — M25561 Pain in right knee: Secondary | ICD-10-CM | POA: Diagnosis not present

## 2021-02-02 DIAGNOSIS — Z7409 Other reduced mobility: Secondary | ICD-10-CM | POA: Diagnosis not present

## 2021-02-02 DIAGNOSIS — Z789 Other specified health status: Secondary | ICD-10-CM | POA: Diagnosis not present

## 2021-02-02 DIAGNOSIS — M1711 Unilateral primary osteoarthritis, right knee: Secondary | ICD-10-CM | POA: Diagnosis not present

## 2021-02-24 DIAGNOSIS — Z96651 Presence of right artificial knee joint: Secondary | ICD-10-CM | POA: Diagnosis not present

## 2021-02-24 DIAGNOSIS — M25461 Effusion, right knee: Secondary | ICD-10-CM | POA: Diagnosis not present

## 2021-02-24 DIAGNOSIS — Z471 Aftercare following joint replacement surgery: Secondary | ICD-10-CM | POA: Diagnosis not present

## 2021-03-23 DIAGNOSIS — N644 Mastodynia: Secondary | ICD-10-CM | POA: Diagnosis not present

## 2021-03-23 DIAGNOSIS — Z803 Family history of malignant neoplasm of breast: Secondary | ICD-10-CM | POA: Diagnosis not present

## 2021-03-25 ENCOUNTER — Other Ambulatory Visit: Payer: Self-pay | Admitting: Obstetrics and Gynecology

## 2021-03-25 DIAGNOSIS — N644 Mastodynia: Secondary | ICD-10-CM

## 2021-04-02 DIAGNOSIS — N644 Mastodynia: Secondary | ICD-10-CM | POA: Diagnosis not present

## 2021-04-24 ENCOUNTER — Other Ambulatory Visit: Payer: Medicare HMO

## 2021-04-30 DIAGNOSIS — I10 Essential (primary) hypertension: Secondary | ICD-10-CM | POA: Diagnosis not present

## 2021-04-30 DIAGNOSIS — Z0000 Encounter for general adult medical examination without abnormal findings: Secondary | ICD-10-CM | POA: Diagnosis not present

## 2021-04-30 DIAGNOSIS — E559 Vitamin D deficiency, unspecified: Secondary | ICD-10-CM | POA: Diagnosis not present

## 2021-04-30 DIAGNOSIS — E7800 Pure hypercholesterolemia, unspecified: Secondary | ICD-10-CM | POA: Diagnosis not present

## 2021-05-06 DIAGNOSIS — G2581 Restless legs syndrome: Secondary | ICD-10-CM | POA: Diagnosis not present

## 2021-05-06 DIAGNOSIS — J45909 Unspecified asthma, uncomplicated: Secondary | ICD-10-CM | POA: Diagnosis not present

## 2021-05-06 DIAGNOSIS — E559 Vitamin D deficiency, unspecified: Secondary | ICD-10-CM | POA: Diagnosis not present

## 2021-05-06 DIAGNOSIS — Z0000 Encounter for general adult medical examination without abnormal findings: Secondary | ICD-10-CM | POA: Diagnosis not present

## 2021-05-06 DIAGNOSIS — I10 Essential (primary) hypertension: Secondary | ICD-10-CM | POA: Diagnosis not present

## 2021-05-06 DIAGNOSIS — Z780 Asymptomatic menopausal state: Secondary | ICD-10-CM | POA: Diagnosis not present

## 2021-05-25 DIAGNOSIS — H2513 Age-related nuclear cataract, bilateral: Secondary | ICD-10-CM | POA: Diagnosis not present

## 2021-05-25 DIAGNOSIS — H5213 Myopia, bilateral: Secondary | ICD-10-CM | POA: Diagnosis not present

## 2021-05-25 DIAGNOSIS — H524 Presbyopia: Secondary | ICD-10-CM | POA: Diagnosis not present

## 2021-05-25 DIAGNOSIS — H25012 Cortical age-related cataract, left eye: Secondary | ICD-10-CM | POA: Diagnosis not present

## 2021-05-25 DIAGNOSIS — H52203 Unspecified astigmatism, bilateral: Secondary | ICD-10-CM | POA: Diagnosis not present

## 2021-06-23 DIAGNOSIS — L814 Other melanin hyperpigmentation: Secondary | ICD-10-CM | POA: Diagnosis not present

## 2021-06-23 DIAGNOSIS — D1801 Hemangioma of skin and subcutaneous tissue: Secondary | ICD-10-CM | POA: Diagnosis not present

## 2021-06-23 DIAGNOSIS — L821 Other seborrheic keratosis: Secondary | ICD-10-CM | POA: Diagnosis not present

## 2021-07-23 ENCOUNTER — Ambulatory Visit: Payer: Medicare HMO | Admitting: Sports Medicine

## 2021-07-23 VITALS — BP 128/78 | Ht 66.0 in | Wt 166.0 lb

## 2021-07-23 DIAGNOSIS — M25512 Pain in left shoulder: Secondary | ICD-10-CM | POA: Diagnosis not present

## 2021-07-23 MED ORDER — MELOXICAM 15 MG PO TABS: ORAL_TABLET | ORAL | 0 refills | Status: DC

## 2021-07-23 NOTE — Progress Notes (Signed)
PCP: Deland Pretty, MD  Subjective:   HPI: Patient is a 71 y.o. female here for L shoulder pain.  Symptoms started 5 months ago and gradually worsened. There was no specific injury, but pain began after carrying heavy luggage around on a trip to [place]. Recently her symptoms have started improving slightly. Pain is located on superior aspect of the left shoulder. It is exacerbated by touching the area, laying on it, or when her bra or seatbelt rests on it. She also has mild pain when raising her arms overhead and across her body. Patient has tried Advil on a few occasions and does find it helpful. No numbness, tingling, or weakness in her arm. She has no prior history of shoulder problems. Denies repetitive overhead activity. She is right hand dominant.  Past Medical History:  Diagnosis Date   Allergic rhinitis    Asthma    Hypertension    Sarcoidosis    stage 1   Sleep apnea     Current Outpatient Medications on File Prior to Visit  Medication Sig Dispense Refill   albuterol (PROAIR HFA) 108 (90 Base) MCG/ACT inhaler Inhale 2 puffs into the lungs every 4 (four) hours as needed for wheezing or shortness of breath. 1 Inhaler 12   Coenzyme Q10 (COQ10) 100 MG CAPS Take 1 capsule by mouth daily.       dexlansoprazole (DEXILANT) 60 MG capsule Take 60 mg by mouth daily.     fluticasone (FLONASE) 50 MCG/ACT nasal spray USE 2 SPRAYS IN EACH NOSTRIL ONCE DAILY 48 g 1   Fluticasone-Salmeterol (ADVAIR DISKUS) 100-50 MCG/DOSE AEPB Inhale 1 puff into the lungs every 12 (twelve) hours. Rinse mouth 3 each 3   montelukast (SINGULAIR) 10 MG tablet Take 1 tablet (10 mg total) by mouth at bedtime. 90 tablet 3   olmesartan-hydrochlorothiazide (BENICAR HCT) 40-12.5 MG tablet Take 1 tablet by mouth daily.  3   pramipexole (MIRAPEX) 0.125 MG tablet Take 1/2 by mouth at bedtime      No current facility-administered medications on file prior to visit.    Past Surgical History:  Procedure Laterality Date    BREAST BIOPSY Left    benign; lumpectomy, cyst removal   BREAST EXCISIONAL BIOPSY     NASAL SINUS SURGERY      Allergies  Allergen Reactions   Codeine     REACTION: nausea   Vancomycin     REACTION: asthma attack    BP 128/78   Ht '5\' 6"'$  (1.676 m)   Wt 166 lb (75.3 kg)   BMI 26.79 kg/m       Objective:  Physical Exam:  Gen: NAD, comfortable in exam room Left Shoulder: Inspection reveals no obvious deformity, atrophy, or asymmetry. No bruising. No swelling Moderate TTP over AC joint. Otherwise nontender Full ROM in flexion, abduction, internal/external rotation NV intact distally Special Tests:  - Impingement: Neg neers, empty can sign. - Supraspinatous: Negative empty can.  5/5 strength with resisted flexion at 20 degrees - Infraspinatous/Teres Minor: 5/5 strength with ER - Biceps tendon: Negative Speeds - AC Joint: Positive cross arm   Assessment & Plan:  1. Left Shoulder Pain- presentation consistent with [facility] joint arthropathy, aggravated in the setting of carrying heavy luggage. Advised ice, anti-inflammatory medication (Rx sent for Meloxicam '15mg'$  daily x7 days then as needed), and relative rest. Anticipate improvement with conservative management. If persistent can consider steroid injection.  Alcus Dad, MD PGY-2, [facility]  Patient seen and evaluated with the resident.  I agree with the above plan of care.  Meloxicam for 7 days to treat her left shoulder AC arthropathy.  If symptoms persist or worsen then consider cortisone injection.  Follow-up for ongoing or recalcitrant issues.

## 2021-07-23 NOTE — Patient Instructions (Signed)
It was great to meet you!  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 This is fairly common and nothing dangerous.  We will try to calm down the flare with a prescription anti-inflammatory medication (Meloxicam) and ice. Do not take Ibuprofen/Motrin/Advil/Aleve while you are taking the Meloxicam.  If there is no improvement in several weeks, let us know and we can consider a steroid injection.  Take care, Dr Rock Nephew and Dr Micheline Chapman

## 2021-07-28 ENCOUNTER — Ambulatory Visit: Payer: Medicare HMO | Admitting: Sports Medicine

## 2021-09-10 ENCOUNTER — Ambulatory Visit: Payer: Medicare HMO | Admitting: Sports Medicine

## 2021-09-10 VITALS — BP 104/68 | Ht 67.0 in | Wt 166.0 lb

## 2021-09-10 DIAGNOSIS — M19019 Primary osteoarthritis, unspecified shoulder: Secondary | ICD-10-CM

## 2021-09-11 NOTE — Progress Notes (Signed)
Patient ID: Joy Rojas, female   DOB: 1951-02-02, 71 y.o.   MRN: [id number]  [name] presents today to discuss cortisone injection into her left AC joint.  She has noticed some improvement with meloxicam but she does not want to take it daily.  She localizes her pain directly over the [facility] joint and has tenderness to palpation on exam here today.  After discussing the injection, she elected to wait until next month.  She has a trip planned [place] in mid [place] and I recommended that she return to the office a few days prior to that trip so that she may get the most benefit from her cortisone injection.  She is in agreement with that plan.  She is currently scheduled to return to the office on Monday, August 14 for the injection.  This note was dictated using Dragon naturally speaking software and may contain errors in syntax, spelling, or content which have not been identified prior to signing this note.

## 2021-10-12 ENCOUNTER — Ambulatory Visit (INDEPENDENT_AMBULATORY_CARE_PROVIDER_SITE_OTHER): Payer: Medicare HMO | Admitting: Sports Medicine

## 2021-10-12 ENCOUNTER — Ambulatory Visit: Payer: Self-pay

## 2021-10-12 VITALS — BP 110/49 | Ht 67.0 in | Wt 166.0 lb

## 2021-10-12 DIAGNOSIS — M25512 Pain in left shoulder: Secondary | ICD-10-CM

## 2021-10-12 MED ORDER — MELOXICAM 15 MG PO TABS: ORAL_TABLET | ORAL | 0 refills | Status: DC

## 2021-10-12 MED ORDER — METHYLPREDNISOLONE ACETATE 40 MG/ML IJ SUSP
20.0000 mg | Freq: Once | INTRAMUSCULAR | Status: AC
  Administered 2021-10-12: 20 mg via INTRA_ARTICULAR

## 2021-10-12 NOTE — Progress Notes (Signed)
Patient ID: [name], female   DOB: [birth year], 71 y.o.   MRN: [id number]  Joy Rojas presents today for a cortisone injection into her left acromioclavicular joint.  Injection performed as below.  Joint was localized with ultrasound prior to injection.  She tolerates this without difficulty.  I also refilled her meloxicam for her.  It does work pretty well for her and she is taking it only as needed.  If symptoms persist then we will get imaging initially in the form of x-ray.  Follow-up for ongoing or recalcitrant issues.  Consent obtained and verified. Time-out conducted. Noted no overlying erythema, induration, or other signs of local infection. Skin prepped in a sterile fashion. Topical analgesic spray: Ethyl chloride. Joint: Left shoulder AC joint Needle: 25-gauge 5/8 inch Completed without difficulty. Meds: 0.5 cc 1% Xylocaine, 0.5 cc (20 mg) Depo-Medrol  Advised to call if fevers/chills, erythema, induration, drainage, or persistent bleeding.

## 2021-10-30 NOTE — Addendum Note (Signed)
Addended by: Cyd Silence on: 10/30/2021 10:18 AM   Modules accepted: Orders

## 2021-11-03 DIAGNOSIS — Z9889 Other specified postprocedural states: Secondary | ICD-10-CM | POA: Diagnosis not present

## 2021-11-03 DIAGNOSIS — Z471 Aftercare following joint replacement surgery: Secondary | ICD-10-CM | POA: Diagnosis not present

## 2021-11-03 DIAGNOSIS — Z96651 Presence of right artificial knee joint: Secondary | ICD-10-CM | POA: Diagnosis not present

## 2021-11-07 ENCOUNTER — Other Ambulatory Visit: Payer: Self-pay | Admitting: Sports Medicine

## 2022-01-13 ENCOUNTER — Ambulatory Visit: Payer: Medicare HMO | Admitting: Sports Medicine

## 2022-01-20 DIAGNOSIS — Z1231 Encounter for screening mammogram for malignant neoplasm of breast: Secondary | ICD-10-CM | POA: Diagnosis not present

## 2022-01-27 ENCOUNTER — Other Ambulatory Visit (HOSPITAL_BASED_OUTPATIENT_CLINIC_OR_DEPARTMENT_OTHER): Payer: Self-pay

## 2022-01-27 MED ORDER — COMIRNATY 30 MCG/0.3ML IM SUSY
PREFILLED_SYRINGE | INTRAMUSCULAR | 0 refills | Status: AC
  Filled 2022-01-27: qty 0.3, 1d supply, fill #0

## 2022-02-02 DIAGNOSIS — N6002 Solitary cyst of left breast: Secondary | ICD-10-CM | POA: Diagnosis not present

## 2022-02-02 DIAGNOSIS — N6321 Unspecified lump in the left breast, upper outer quadrant: Secondary | ICD-10-CM | POA: Diagnosis not present

## 2022-03-23 ENCOUNTER — Ambulatory Visit: Payer: Medicare HMO | Admitting: Sports Medicine

## 2022-03-23 VITALS — BP 120/68 | Ht 66.0 in | Wt 169.0 lb

## 2022-03-23 DIAGNOSIS — M19019 Primary osteoarthritis, unspecified shoulder: Secondary | ICD-10-CM

## 2022-03-23 DIAGNOSIS — M7989 Other specified soft tissue disorders: Secondary | ICD-10-CM

## 2022-03-23 MED ORDER — MELOXICAM 15 MG PO TABS: ORAL_TABLET | ORAL | 1 refills | Status: AC

## 2022-03-23 NOTE — Assessment & Plan Note (Signed)
Patient with 1 month of swelling and pain of right middle finger, no inciting trauma. Finger is stiff and worst in the morning. Heberden nodules present on DIPs of other hand. Suspect osteoarthritis. Treating shoulder pain with 5-day course of scheduled Mobic, should see improvement of finger swelling as well. If no improvement, will obtain XR of finger.

## 2022-03-23 NOTE — Progress Notes (Cosign Needed)
   Established Patient Office Visit  Subjective   Patient ID: Joy Rojas, female    DOB: [birth year]  Age: 72 y.o. MRN: [id number]  CC: Right shoulder pain, right middle finger swelling   HPI: Joy Rojas is a 72 year old female with a history of left AC joint arthropathy who presents with right shoulder pain and right middle finger swelling. She has been seen most recently on 8/14 for left shoulder pain and left AC cortisone injection and recently developed right shoulder pain at her [facility] joint. 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 Mobic was very helpful for her left shoulder pain.   She noticed the swelling in her right middle finger about a month ago and feels that it has remained unchanged. It feels stiff and achey in the morning but gets better throughout the day. She thinks she may have had similar swelling in her right ring finger before as she previously had trouble putting on her rings. She has not tried icing or NSAIDs. She denies any trauma to her finger. She denies dry mouth and symptoms of Raynaud's.  Objective:  Vitals:  BP 120/68   Ht '5\' 6"'$  (1.676 m)   Wt 169 lb (76.7 kg)   BMI 27.28 kg/m  Vital signs reviewed.  Physical Exam  Right middle finger:  Swollen present throughout, most noticeably at PIP. Tender to palpation of PIP and DIP. Unable to fully flex at PIP. Heberden nodules present on DIP of several other fingers on right and left hand.  Right shoulder:  Tender to palpation of AC joint, nontender to palpation of biceps tendon. Full ROM. Strength 5/5 with resisted external rotation and abduction. Negative empty can test.    Assessment & Plan:   Problem List Items Addressed This Visit       Musculoskeletal and Integument   AC joint arthropathy    Patient with bilateral AC joint arthropathy. Rotator cuff intact. Instructed to avoid exercises that go above shoulder level or put a lot of stress on  the joint (e.g. pushups). Start scheduled 5-day course of Mobic for pain, then PRN. Okay to pre-treat gym sessions on Tues/Thurs with Mobic. Follow-up as needed.         Other   Swelling of right middle finger - Primary    Patient with 1 month of swelling and pain of right middle finger, no inciting trauma. Finger is stiff and worst in the morning. Heberden nodules present on DIPs of other hand. Suspect osteoarthritis. Treating shoulder pain with 5-day course of scheduled Mobic, should see improvement of finger swelling as well. If no improvement, will obtain XR of finger.       Sabino Dick, MS4 [facility]

## 2022-03-23 NOTE — Assessment & Plan Note (Signed)
Patient with bilateral AC joint arthropathy. Rotator cuff intact. Instructed to avoid exercises that go above shoulder level or put a lot of stress on the joint (e.g. pushups). Start scheduled 5-day course of Mobic for pain, then PRN. Okay to pre-treat gym sessions on Tues/Thurs with Mobic. Follow-up as needed.

## 2022-03-29 ENCOUNTER — Encounter: Payer: Self-pay | Admitting: Sports Medicine

## 2022-03-29 ENCOUNTER — Other Ambulatory Visit: Payer: Self-pay

## 2022-03-29 DIAGNOSIS — Z803 Family history of malignant neoplasm of breast: Secondary | ICD-10-CM | POA: Diagnosis not present

## 2022-03-29 DIAGNOSIS — M8580 Other specified disorders of bone density and structure, unspecified site: Secondary | ICD-10-CM | POA: Diagnosis not present

## 2022-03-29 DIAGNOSIS — M7989 Other specified soft tissue disorders: Secondary | ICD-10-CM

## 2022-03-29 DIAGNOSIS — Z9189 Other specified personal risk factors, not elsewhere classified: Secondary | ICD-10-CM | POA: Diagnosis not present

## 2022-03-29 DIAGNOSIS — Z01419 Encounter for gynecological examination (general) (routine) without abnormal findings: Secondary | ICD-10-CM | POA: Diagnosis not present

## 2022-04-01 ENCOUNTER — Other Ambulatory Visit: Payer: Self-pay | Admitting: Obstetrics and Gynecology

## 2022-04-01 DIAGNOSIS — M8580 Other specified disorders of bone density and structure, unspecified site: Secondary | ICD-10-CM

## 2022-04-06 ENCOUNTER — Ambulatory Visit
Admission: RE | Admit: 2022-04-06 | Discharge: 2022-04-06 | Disposition: A | Discharge status: Home / Self Care | Payer: Medicare HMO | Source: Ambulatory Visit | Attending: Obstetrics and Gynecology | Admitting: Obstetrics and Gynecology

## 2022-04-06 ENCOUNTER — Ambulatory Visit
Admission: RE | Admit: 2022-04-06 | Discharge: 2022-04-06 | Disposition: A | Discharge status: Home / Self Care | Payer: Medicare HMO | Source: Ambulatory Visit | Attending: Sports Medicine | Admitting: Sports Medicine

## 2022-04-06 DIAGNOSIS — M7989 Other specified soft tissue disorders: Secondary | ICD-10-CM

## 2022-04-06 DIAGNOSIS — M19041 Primary osteoarthritis, right hand: Secondary | ICD-10-CM | POA: Diagnosis not present

## 2022-04-06 DIAGNOSIS — Z780 Asymptomatic menopausal state: Secondary | ICD-10-CM | POA: Diagnosis not present

## 2022-04-06 DIAGNOSIS — M8580 Other specified disorders of bone density and structure, unspecified site: Secondary | ICD-10-CM

## 2022-04-06 DIAGNOSIS — M85851 Other specified disorders of bone density and structure, right thigh: Secondary | ICD-10-CM | POA: Diagnosis not present

## 2022-04-09 ENCOUNTER — Encounter: Payer: Self-pay | Admitting: Sports Medicine

## 2022-05-05 DIAGNOSIS — Z0000 Encounter for general adult medical examination without abnormal findings: Secondary | ICD-10-CM | POA: Diagnosis not present

## 2022-05-05 DIAGNOSIS — E559 Vitamin D deficiency, unspecified: Secondary | ICD-10-CM | POA: Diagnosis not present

## 2022-05-05 DIAGNOSIS — I10 Essential (primary) hypertension: Secondary | ICD-10-CM | POA: Diagnosis not present

## 2022-05-12 ENCOUNTER — Telehealth: Payer: Self-pay

## 2022-05-12 DIAGNOSIS — G2581 Restless legs syndrome: Secondary | ICD-10-CM | POA: Diagnosis not present

## 2022-05-12 DIAGNOSIS — Z0000 Encounter for general adult medical examination without abnormal findings: Secondary | ICD-10-CM | POA: Diagnosis not present

## 2022-05-12 DIAGNOSIS — J309 Allergic rhinitis, unspecified: Secondary | ICD-10-CM | POA: Diagnosis not present

## 2022-05-12 DIAGNOSIS — K219 Gastro-esophageal reflux disease without esophagitis: Secondary | ICD-10-CM | POA: Diagnosis not present

## 2022-05-12 DIAGNOSIS — E559 Vitamin D deficiency, unspecified: Secondary | ICD-10-CM | POA: Diagnosis not present

## 2022-05-12 DIAGNOSIS — E7800 Pure hypercholesterolemia, unspecified: Secondary | ICD-10-CM | POA: Diagnosis not present

## 2022-05-12 DIAGNOSIS — I10 Essential (primary) hypertension: Secondary | ICD-10-CM | POA: Diagnosis not present

## 2022-05-12 NOTE — Patient Outreach (Signed)
  Care Coordination   In Person Provider Office Visit Note   05/12/2022 Name: Joy Rojas MRN: [id number] DOB: 12-Jan-1951  Joy Rojas is a 72 y.o. year old female who sees Deland Pretty, MD for primary care. I engaged with Lillia Corporal in the providers office today.  What matters to the patients health and wellness today?  none    Goals Addressed             This Visit's Progress    COMPLETED: Care Coordination Activities-no follow up required       Care Coordination Interventions: Advised patient to Annual Wellness exam. Discussed [facility] services and support. Assessed SDOH. Advised to discuss with primary care physician if services needed in the future. Interventions Today    Flowsheet Row Most Recent Value  General Interventions   General Interventions Discussed/Reviewed General Interventions Discussed, Doctor Visits, Health Screening  Doctor Visits Discussed/Reviewed Doctor Visits Discussed  Health Screening Colonoscopy, Mammogram  Education Interventions   Education Provided Provided Education  Provided Verbal Education On --  [preventive health]  Mental Health Interventions   Mental Health Discussed/Reviewed Mental Health Discussed  Pharmacy Interventions   Pharmacy Dicussed/Reviewed Medications and their functions, Pharmacy Topics Discussed  Safety Interventions   Safety Discussed/Reviewed Safety Discussed             SDOH assessments and interventions completed:  Yes  SDOH Interventions Today    Flowsheet Row Most Recent Value  SDOH Interventions   Food Insecurity Interventions Intervention Not Indicated  Transportation Interventions Intervention Not Indicated        Care Coordination Interventions:  Yes, provided   Follow up plan: No further intervention required.   Encounter Outcome:  Pt. Visit Completed   Jone Baseman, RN, MSN [place] [facility] Care Management Coordinator Direct Line [phone]

## 2022-05-12 NOTE — Patient Instructions (Signed)
Visit Information  Thank you for taking time to visit with me today. Please don't hesitate to contact me if I can be of assistance to you.   Following are the goals we discussed today:   Goals Addressed             This Visit's Progress    COMPLETED: Care Coordination Activities-no follow up required       Care Coordination Interventions: Advised patient to Annual Wellness exam. Discussed [facility] services and support. Assessed SDOH. Advised to discuss with primary care physician if services needed in the future. Interventions Today    Flowsheet Row Most Recent Value  General Interventions   General Interventions Discussed/Reviewed General Interventions Discussed, Doctor Visits, Health Screening  Doctor Visits Discussed/Reviewed Doctor Visits Discussed  Health Screening Colonoscopy, Mammogram  Education Interventions   Education Provided Provided Education  Provided Verbal Education On --  [preventive health]  Mental Health Interventions   Mental Health Discussed/Reviewed Mental Health Discussed  Pharmacy Interventions   Pharmacy Dicussed/Reviewed Medications and their functions, Pharmacy Topics Discussed  Safety Interventions   Safety Discussed/Reviewed Safety Discussed              If you are experiencing a Mental Health or [place] or need someone to talk to, please call the Suicide and Crisis Lifeline: 988   Patient verbalizes understanding of instructions and care plan provided today and agrees to view in [place]. Active MyChart status and patient understanding of how to access instructions and care plan via MyChart confirmed with patient.     The patient has been provided with contact information for the care management team and has been advised to call with any health related questions or concerns.   Jone Baseman, RN, MSN [place] [facility] Care Management Coordinator Direct Line [phone]

## 2022-06-08 ENCOUNTER — Other Ambulatory Visit: Payer: Self-pay | Admitting: Sports Medicine

## 2022-06-29 DIAGNOSIS — H01114 Allergic dermatitis of left upper eyelid: Secondary | ICD-10-CM | POA: Diagnosis not present

## 2022-06-29 DIAGNOSIS — H2513 Age-related nuclear cataract, bilateral: Secondary | ICD-10-CM | POA: Diagnosis not present

## 2022-06-29 DIAGNOSIS — H5213 Myopia, bilateral: Secondary | ICD-10-CM | POA: Diagnosis not present

## 2022-07-31 ENCOUNTER — Telehealth: Payer: Medicare HMO | Admitting: Physician Assistant

## 2022-07-31 DIAGNOSIS — R21 Rash and other nonspecific skin eruption: Secondary | ICD-10-CM | POA: Diagnosis not present

## 2022-07-31 MED ORDER — PREDNISONE 20 MG PO TABS: 40.0000 mg | ORAL_TABLET | Freq: Every day | ORAL | 0 refills | Status: AC

## 2022-07-31 NOTE — Patient Instructions (Signed)
Joy Rojas, thank you for joining Myrla Halsted, PA-C for today's virtual visit.  While this provider is not your primary care provider (PCP), if your PCP is located in our provider database this encounter information will be shared with them immediately following your visit.   A [facility] MyChart account gives you access to today's visit and all your visits, tests, and labs performed at [facility] " click here if you don't have a [facility] MyChart account or go to [email or web address]  Consent: (Patient) Joy Rojas provided verbal consent for this virtual visit at the beginning of the encounter.  Current Medications:  Current Outpatient Medications:    predniSONE (DELTASONE) 20 MG tablet, Take 2 tablets (40 mg total) by mouth daily with breakfast for 5 days., Disp: 10 tablet, Rfl: 0   albuterol (PROAIR HFA) 108 (90 Base) MCG/ACT inhaler, Inhale 2 puffs into the lungs every 4 (four) hours as needed for wheezing or shortness of breath., Disp: 1 Inhaler, Rfl: 12   Coenzyme Q10 (COQ10) 100 MG CAPS, Take 1 capsule by mouth daily.  , Disp: , Rfl:    COVID-19 mRNA vaccine 2023-2024 (COMIRNATY) syringe, Inject into the muscle., Disp: 0.3 mL, Rfl: 0   dexlansoprazole (DEXILANT) 60 MG capsule, Take 60 mg by mouth daily., Disp: , Rfl:    fluticasone (FLONASE) 50 MCG/ACT nasal spray, USE 2 SPRAYS IN EACH NOSTRIL ONCE DAILY, Disp: 48 g, Rfl: 1   Fluticasone-Salmeterol (ADVAIR DISKUS) 100-50 MCG/DOSE AEPB, Inhale 1 puff into the lungs every 12 (twelve) hours. Rinse mouth, Disp: 3 each, Rfl: 3   meloxicam (MOBIC) 15 MG tablet, Take 1 tablet daily with food for 5 days. Then take as needed., Disp: 40 tablet, Rfl: 1   montelukast (SINGULAIR) 10 MG tablet, Take 1 tablet (10 mg total) by mouth at bedtime., Disp: 90 tablet, Rfl: 3   olmesartan-hydrochlorothiazide (BENICAR HCT) 40-12.5 MG tablet, Take 1 tablet by mouth daily., Disp: , Rfl: 3   pramipexole (MIRAPEX) 0.125 MG tablet, Take  1/2 by mouth at bedtime , Disp: , Rfl:    Medications ordered in this encounter:  Meds ordered this encounter  Medications   predniSONE (DELTASONE) 20 MG tablet    Sig: Take 2 tablets (40 mg total) by mouth daily with breakfast for 5 days.    Dispense:  10 tablet    Refill:  0    Order Specific Question:   Supervising Provider    Answer:   Merrilee Jansky [id number]     *If you need refills on other medications prior to your next appointment, please contact your pharmacy*  Follow-Up: Call back or seek an in-person evaluation if the symptoms worsen or if the condition fails to improve as anticipated.  [facility] [phone]  Other Instructions Take prednisone as prescribed.  Continue with cortisone cream.  If no improvement follow up with PCP or Urgent Care.    If you have been instructed to have an in-person evaluation today at a local Urgent Care facility, please use the link below. It will take you to a list of all of our available [facility], including address, phone number and hours of operation. Please do not delay care.  [facility]  If you or a family member do not have a primary care provider, use the link below to schedule a visit and establish care. When you choose a [facility] primary care physician or advanced practice provider, you gain  a long-term partner in health. Find a Primary Care Provider  Learn more about [place]'s in-office and virtual care options: [place] - Get Care Now

## 2022-07-31 NOTE — Progress Notes (Signed)
Virtual Visit Consent   [name], you are scheduled for a virtual visit with a [place] provider today. Just as with appointments in the office, your consent must be obtained to participate. Your consent will be active for this visit and any virtual visit you may have with one of our providers in the next 365 days. If you have a MyChart account, a copy of this consent can be sent to you electronically.  As this is a virtual visit, video technology does not allow for your provider to perform a traditional examination. This may limit your provider's ability to fully assess your condition. If your provider identifies any concerns that need to be evaluated in person or the need to arrange testing (such as labs, EKG, etc.), we will make arrangements to do so. Although advances in technology are sophisticated, we cannot ensure that it will always work on either your end or our end. If the connection with a video visit is poor, the visit may have to be switched to a telephone visit. With either a video or telephone visit, we are not always able to ensure that we have a secure connection.  By engaging in this virtual visit, you consent to the provision of healthcare and authorize for your insurance to be billed (if applicable) for the services provided during this visit. Depending on your insurance coverage, you may receive a charge related to this service.  I need to obtain your verbal consent now. Are you willing to proceed with your visit today? TALAYA WIEBKE has provided verbal consent on 07/31/2022 for a virtual visit (video or telephone). Tylene Fantasia Ward, PA-C  Date: 07/31/2022 9:04 AM  Virtual Visit via Video Note   I, Tylene Fantasia Ward, connected with  LASHYIA LAGNESE  ([id number], 1950-08-04) on 07/31/22 at  9:00 AM EDT by a video-enabled telemedicine application and verified that I am speaking with the correct person using two identifiers.  Location: Patient: Virtual Visit Location Patient:  Home Provider: Virtual Visit Location Provider: Home   I discussed the limitations of evaluation and management by telemedicine and the availability of in person appointments. The patient expressed understanding and agreed to proceed.    History of Present Illness: Joy Rojas is a 72 y.o. who identifies as a female who was assigned female at birth, and is being seen today for rash to her neck and chest that is very itchy.  She has tried cortisone cream, vaseline, cereve with temporary relief.  She denies new lotions, soaps, or detergents.   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   Sarcoidosis 07/31/2007   Seasonal and perennial allergic rhinitis 07/31/2007    Allergies:  Allergies  Allergen Reactions   Codeine     REACTION: nausea   Vancomycin     REACTION: asthma attack   Medications:  Current Outpatient Medications:    predniSONE (DELTASONE) 20 MG tablet, Take 2 tablets (40 mg total) by mouth daily with breakfast for 5 days., Disp: 10 tablet, Rfl: 0   albuterol (PROAIR HFA) 108 (90 Base) MCG/ACT inhaler, Inhale 2 puffs into the lungs every 4 (four) hours as needed for wheezing or shortness of breath., Disp: 1 Inhaler, Rfl: 12   Coenzyme Q10 (COQ10) 100 MG CAPS, Take 1  capsule by mouth daily.  , Disp: , Rfl:    COVID-19 mRNA vaccine 2023-2024 (COMIRNATY) syringe, Inject into the muscle., Disp: 0.3 mL, Rfl: 0   dexlansoprazole (DEXILANT) 60 MG capsule, Take 60 mg by mouth daily., Disp: , Rfl:    fluticasone (FLONASE) 50 MCG/ACT nasal spray, USE 2 SPRAYS IN EACH NOSTRIL ONCE DAILY, Disp: 48 g, Rfl: 1   Fluticasone-Salmeterol (ADVAIR DISKUS) 100-50 MCG/DOSE AEPB, Inhale 1 puff into the lungs every 12 (twelve) hours. Rinse mouth, Disp: 3 each, Rfl: 3    meloxicam (MOBIC) 15 MG tablet, Take 1 tablet daily with food for 5 days. Then take as needed., Disp: 40 tablet, Rfl: 1   montelukast (SINGULAIR) 10 MG tablet, Take 1 tablet (10 mg total) by mouth at bedtime., Disp: 90 tablet, Rfl: 3   olmesartan-hydrochlorothiazide (BENICAR HCT) 40-12.5 MG tablet, Take 1 tablet by mouth daily., Disp: , Rfl: 3   pramipexole (MIRAPEX) 0.125 MG tablet, Take 1/2 by mouth at bedtime , Disp: , Rfl:   Observations/Objective: Patient is well-developed, well-nourished in no acute distress.  Resting comfortably at home.  Head is normocephalic, atraumatic.  No labored breathing.  Speech is clear and coherent with logical content.  Patient is alert and oriented at baseline.    Assessment and Plan: 1. Rash - predniSONE (DELTASONE) 20 MG tablet; Take 2 tablets (40 mg total) by mouth daily with breakfast for 5 days.  Dispense: 10 tablet; Refill: 0    Follow Up Instructions: I discussed the assessment and treatment plan with the patient. The patient was provided an opportunity to ask questions and all were answered. The patient agreed with the plan and demonstrated an understanding of the instructions.  A copy of instructions were sent to the patient via MyChart unless otherwise noted below.     The patient was advised to call back or seek an in-person evaluation if the symptoms worsen or if the condition fails to improve as anticipated.  Time:  I spent 5 minutes with the patient via telehealth technology discussing the above problems/concerns.    Tylene Fantasia Ward, PA-C

## 2022-08-25 DIAGNOSIS — L309 Dermatitis, unspecified: Secondary | ICD-10-CM | POA: Diagnosis not present

## 2022-11-16 DIAGNOSIS — L2089 Other atopic dermatitis: Secondary | ICD-10-CM | POA: Diagnosis not present

## 2022-11-16 DIAGNOSIS — L821 Other seborrheic keratosis: Secondary | ICD-10-CM | POA: Diagnosis not present

## 2022-11-30 DIAGNOSIS — Z96651 Presence of right artificial knee joint: Secondary | ICD-10-CM | POA: Diagnosis not present

## 2022-11-30 DIAGNOSIS — Z471 Aftercare following joint replacement surgery: Secondary | ICD-10-CM | POA: Diagnosis not present

## 2022-11-30 DIAGNOSIS — M25562 Pain in left knee: Secondary | ICD-10-CM | POA: Diagnosis not present

## 2022-11-30 DIAGNOSIS — G8929 Other chronic pain: Secondary | ICD-10-CM | POA: Diagnosis not present

## 2022-11-30 DIAGNOSIS — M1712 Unilateral primary osteoarthritis, left knee: Secondary | ICD-10-CM | POA: Diagnosis not present

## 2023-01-26 DIAGNOSIS — Z1231 Encounter for screening mammogram for malignant neoplasm of breast: Secondary | ICD-10-CM | POA: Diagnosis not present

## 2023-02-07 DIAGNOSIS — L308 Other specified dermatitis: Secondary | ICD-10-CM | POA: Diagnosis not present

## 2023-03-03 DIAGNOSIS — L239 Allergic contact dermatitis, unspecified cause: Secondary | ICD-10-CM | POA: Diagnosis not present

## 2023-03-03 DIAGNOSIS — L011 Impetiginization of other dermatoses: Secondary | ICD-10-CM | POA: Diagnosis not present

## 2023-03-03 DIAGNOSIS — L309 Dermatitis, unspecified: Secondary | ICD-10-CM | POA: Diagnosis not present

## 2023-03-03 DIAGNOSIS — L235 Allergic contact dermatitis due to other chemical products: Secondary | ICD-10-CM | POA: Diagnosis not present

## 2023-03-30 IMAGING — MG MM DIGITAL SCREENING BILAT W/ TOMO AND CAD
6 of 10 series · 6 of 30 positions shown · non-contrast
Comparison: Previous exam(s).

CLINICAL DATA: Screening.

EXAM:
DIGITAL SCREENING BILATERAL MAMMOGRAM WITH TOMOSYNTHESIS AND CAD
TECHNIQUE: Bilateral screening digital craniocaudal and mediolateral oblique
mammograms were obtained. Bilateral screening digital breast
tomosynthesis was performed. The images were evaluated with
computer-aided detection.

[L CC synth-2D]
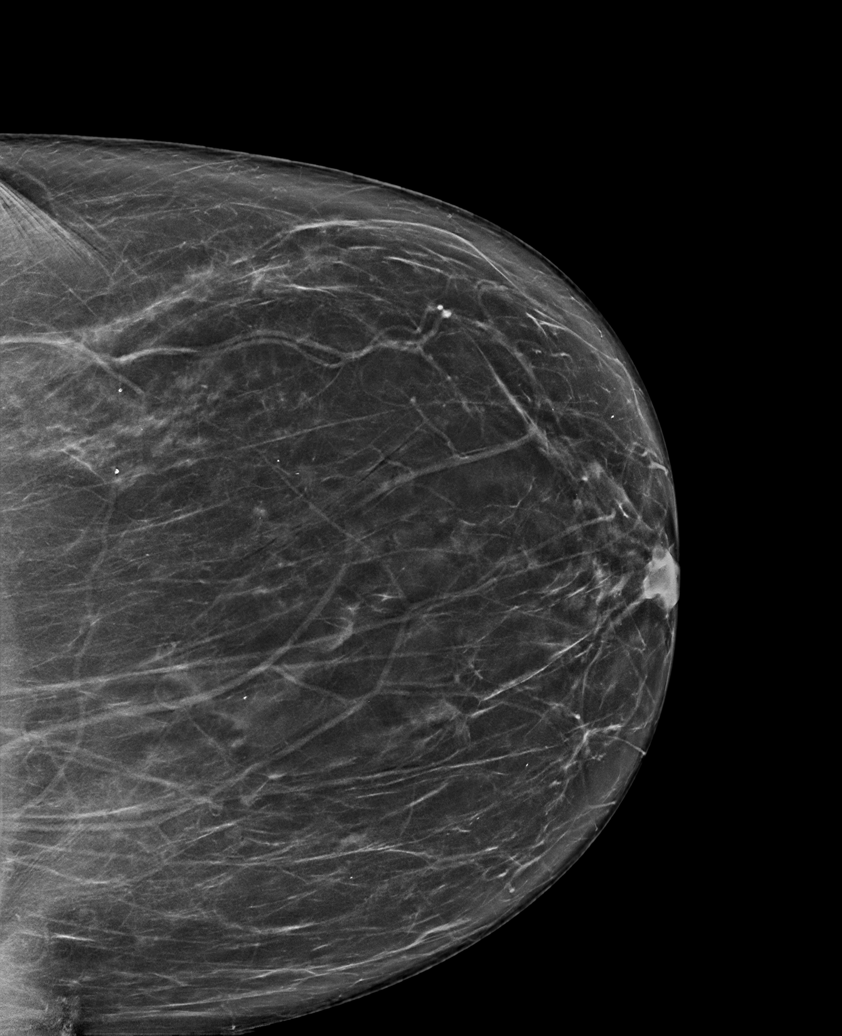

[L MLO synth-2D]
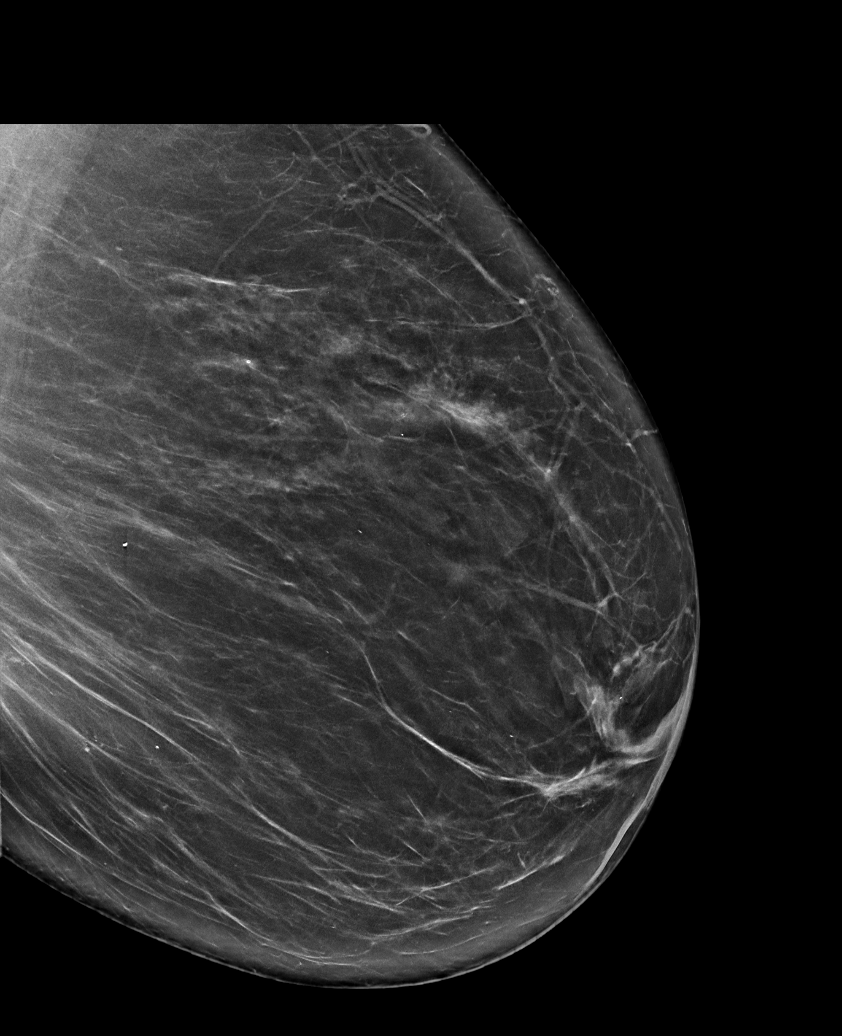

[R CV synth-2D]
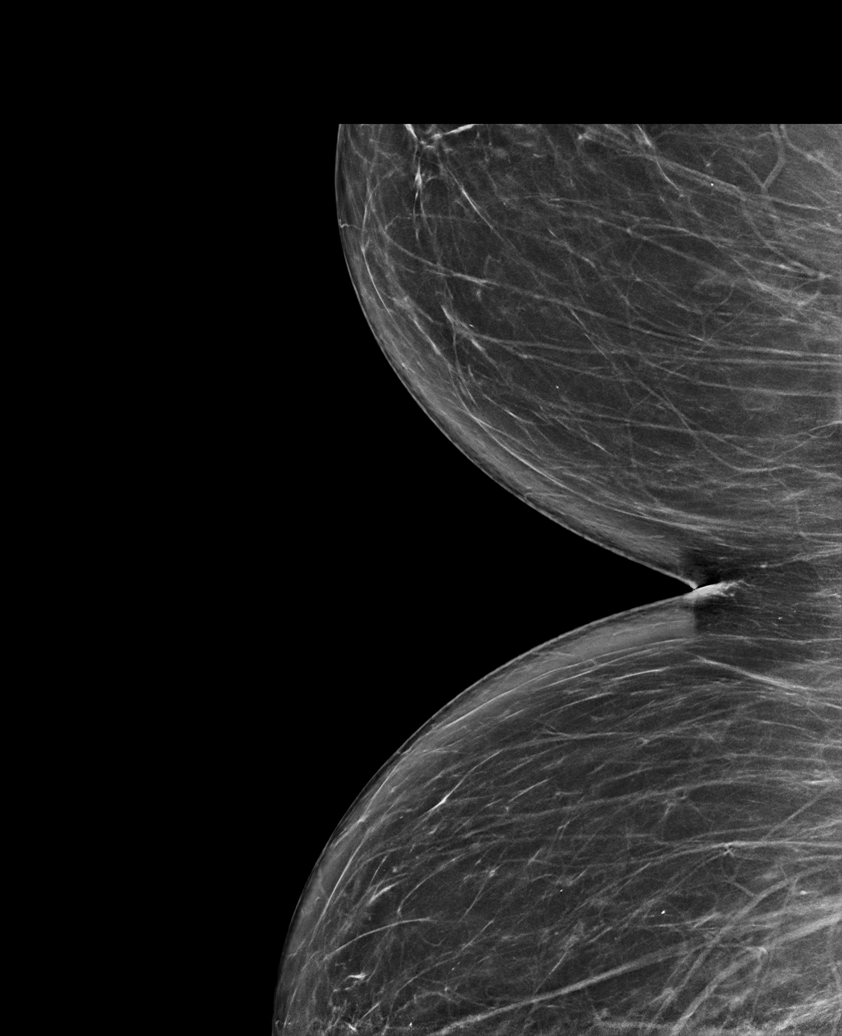

[R CC synth-2D]
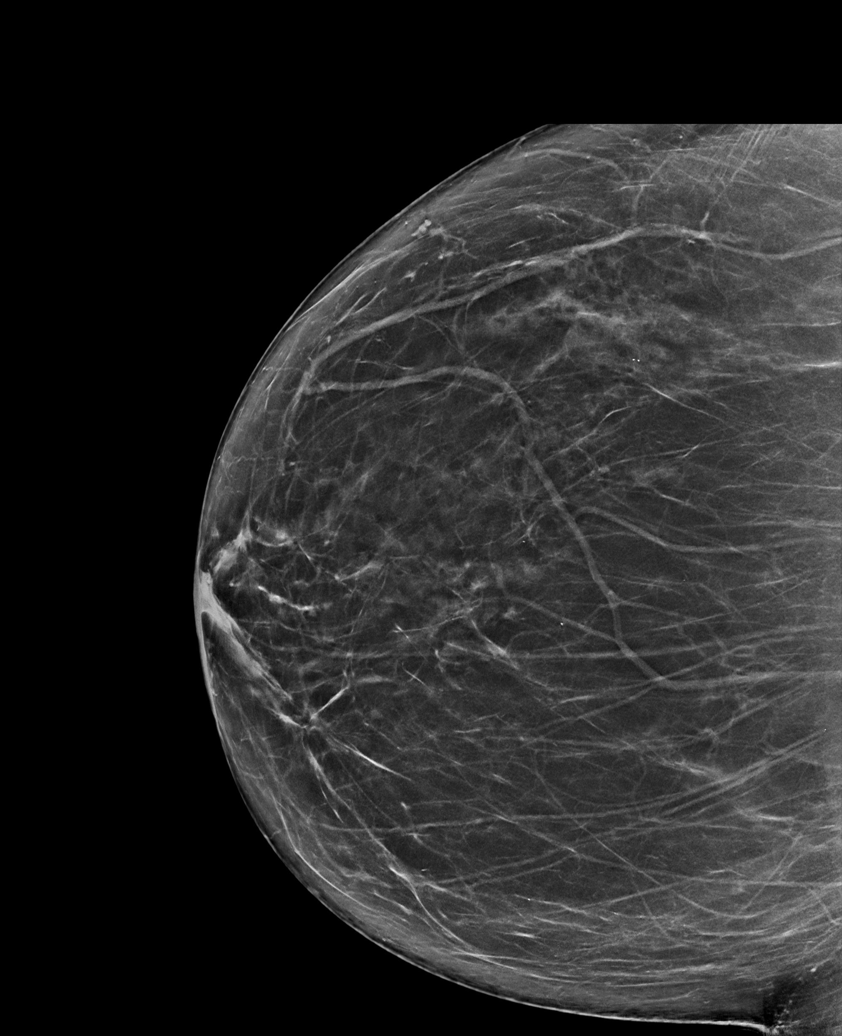

[R MLO synth-2D]
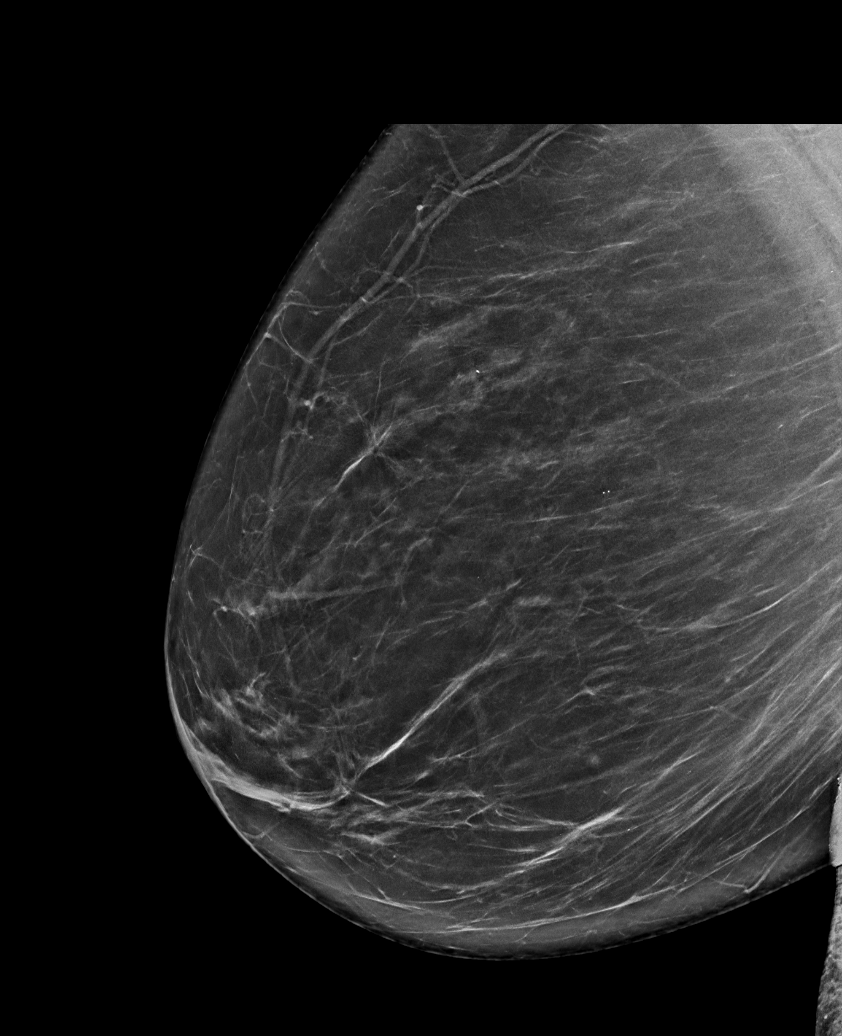

[R CV tomo · tomo slice 39/76.0]
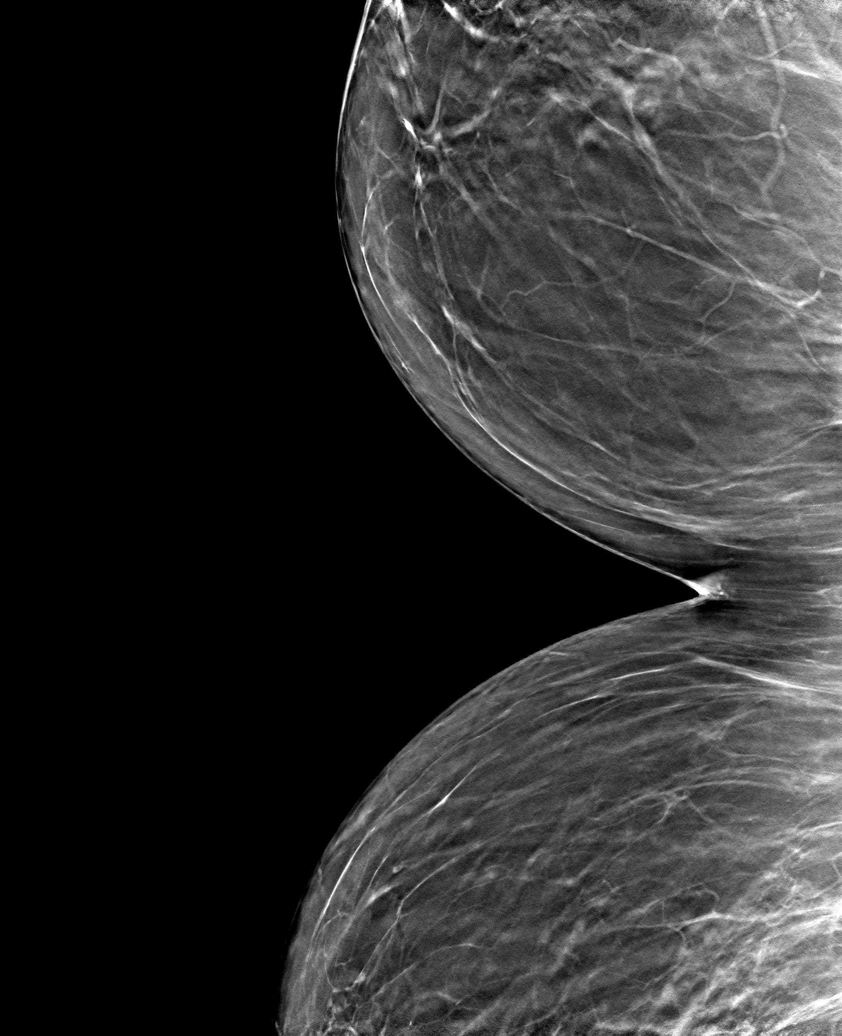

[6 of 30 positions shown; findings below may reference images not displayed]

ACR Breast Density Category b: There are scattered areas of
fibroglandular density.
FINDINGS: There are no findings suspicious for malignancy.
IMPRESSION: No mammographic evidence of malignancy. A result letter of this
screening mammogram will be mailed directly to the patient.

RECOMMENDATION:
Screening mammogram in one year. (Code:51-O-LD2)

BI-RADS CATEGORY  1: Negative.

## 2023-03-31 DIAGNOSIS — H60501 Unspecified acute noninfective otitis externa, right ear: Secondary | ICD-10-CM | POA: Diagnosis not present

## 2023-04-07 DIAGNOSIS — H60501 Unspecified acute noninfective otitis externa, right ear: Secondary | ICD-10-CM | POA: Diagnosis not present

## 2023-05-09 DIAGNOSIS — R21 Rash and other nonspecific skin eruption: Secondary | ICD-10-CM | POA: Diagnosis not present

## 2023-05-09 DIAGNOSIS — L259 Unspecified contact dermatitis, unspecified cause: Secondary | ICD-10-CM | POA: Diagnosis not present

## 2023-05-12 DIAGNOSIS — L232 Allergic contact dermatitis due to cosmetics: Secondary | ICD-10-CM | POA: Diagnosis not present

## 2023-05-12 DIAGNOSIS — L234 Allergic contact dermatitis due to dyes: Secondary | ICD-10-CM | POA: Diagnosis not present

## 2023-05-23 DIAGNOSIS — G8929 Other chronic pain: Secondary | ICD-10-CM | POA: Diagnosis not present

## 2023-05-23 DIAGNOSIS — M25562 Pain in left knee: Secondary | ICD-10-CM | POA: Diagnosis not present

## 2023-05-23 DIAGNOSIS — M1712 Unilateral primary osteoarthritis, left knee: Secondary | ICD-10-CM | POA: Diagnosis not present

## 2023-05-25 ENCOUNTER — Other Ambulatory Visit (HOSPITAL_COMMUNITY): Payer: Self-pay | Admitting: Registered Nurse

## 2023-05-25 DIAGNOSIS — R5383 Other fatigue: Secondary | ICD-10-CM | POA: Diagnosis not present

## 2023-05-25 DIAGNOSIS — I10 Essential (primary) hypertension: Secondary | ICD-10-CM | POA: Diagnosis not present

## 2023-05-25 DIAGNOSIS — J309 Allergic rhinitis, unspecified: Secondary | ICD-10-CM | POA: Diagnosis not present

## 2023-05-25 DIAGNOSIS — R5381 Other malaise: Secondary | ICD-10-CM | POA: Diagnosis not present

## 2023-05-25 DIAGNOSIS — E7800 Pure hypercholesterolemia, unspecified: Secondary | ICD-10-CM

## 2023-05-25 DIAGNOSIS — G2581 Restless legs syndrome: Secondary | ICD-10-CM | POA: Diagnosis not present

## 2023-05-25 DIAGNOSIS — Z0000 Encounter for general adult medical examination without abnormal findings: Secondary | ICD-10-CM | POA: Diagnosis not present

## 2023-05-25 DIAGNOSIS — E559 Vitamin D deficiency, unspecified: Secondary | ICD-10-CM | POA: Diagnosis not present

## 2023-06-01 DIAGNOSIS — Z01419 Encounter for gynecological examination (general) (routine) without abnormal findings: Secondary | ICD-10-CM | POA: Diagnosis not present

## 2023-06-01 DIAGNOSIS — Z124 Encounter for screening for malignant neoplasm of cervix: Secondary | ICD-10-CM | POA: Diagnosis not present

## 2023-06-01 DIAGNOSIS — L900 Lichen sclerosus et atrophicus: Secondary | ICD-10-CM | POA: Diagnosis not present

## 2023-06-01 DIAGNOSIS — Z9889 Other specified postprocedural states: Secondary | ICD-10-CM | POA: Diagnosis not present

## 2023-06-15 ENCOUNTER — Other Ambulatory Visit (HOSPITAL_COMMUNITY)

## 2023-06-27 ENCOUNTER — Other Ambulatory Visit (HOSPITAL_COMMUNITY): Payer: Self-pay

## 2023-06-27 ENCOUNTER — Encounter (HOSPITAL_COMMUNITY): Payer: Self-pay

## 2023-06-29 DIAGNOSIS — R5383 Other fatigue: Secondary | ICD-10-CM | POA: Diagnosis not present

## 2023-06-29 DIAGNOSIS — I10 Essential (primary) hypertension: Secondary | ICD-10-CM | POA: Diagnosis not present

## 2023-06-29 DIAGNOSIS — E559 Vitamin D deficiency, unspecified: Secondary | ICD-10-CM | POA: Diagnosis not present

## 2023-07-06 DIAGNOSIS — H33302 Unspecified retinal break, left eye: Secondary | ICD-10-CM | POA: Diagnosis not present

## 2023-07-06 DIAGNOSIS — H2513 Age-related nuclear cataract, bilateral: Secondary | ICD-10-CM | POA: Diagnosis not present

## 2023-07-06 DIAGNOSIS — H5213 Myopia, bilateral: Secondary | ICD-10-CM | POA: Diagnosis not present

## 2023-07-27 ENCOUNTER — Other Ambulatory Visit (HOSPITAL_COMMUNITY): Payer: Self-pay

## 2023-08-10 DIAGNOSIS — S93491A Sprain of other ligament of right ankle, initial encounter: Secondary | ICD-10-CM | POA: Diagnosis not present

## 2023-08-10 DIAGNOSIS — M25571 Pain in right ankle and joints of right foot: Secondary | ICD-10-CM | POA: Diagnosis not present

## 2023-08-15 ENCOUNTER — Ambulatory Visit (HOSPITAL_BASED_OUTPATIENT_CLINIC_OR_DEPARTMENT_OTHER)
Admission: RE | Admit: 2023-08-15 | Discharge: 2023-08-15 | Disposition: A | Discharge status: Home / Self Care | Payer: Self-pay | Source: Ambulatory Visit | Attending: Registered Nurse | Admitting: Registered Nurse

## 2023-08-15 DIAGNOSIS — E7800 Pure hypercholesterolemia, unspecified: Secondary | ICD-10-CM | POA: Insufficient documentation

## 2023-11-14 DIAGNOSIS — L232 Allergic contact dermatitis due to cosmetics: Secondary | ICD-10-CM | POA: Diagnosis not present

## 2023-11-24 ENCOUNTER — Ambulatory Visit
Admission: RE | Admit: 2023-11-24 | Discharge: 2023-11-24 | Disposition: A | Discharge status: Home / Self Care | Source: Ambulatory Visit | Attending: Nurse Practitioner | Admitting: Nurse Practitioner

## 2023-11-24 ENCOUNTER — Other Ambulatory Visit: Payer: Self-pay | Admitting: Nurse Practitioner

## 2023-11-24 DIAGNOSIS — M79672 Pain in left foot: Secondary | ICD-10-CM

## 2023-11-24 DIAGNOSIS — M79671 Pain in right foot: Secondary | ICD-10-CM | POA: Diagnosis not present

## 2023-11-29 DIAGNOSIS — I10 Essential (primary) hypertension: Secondary | ICD-10-CM | POA: Diagnosis not present

## 2023-11-29 DIAGNOSIS — Z1211 Encounter for screening for malignant neoplasm of colon: Secondary | ICD-10-CM | POA: Diagnosis not present

## 2023-12-01 ENCOUNTER — Other Ambulatory Visit: Payer: Self-pay

## 2023-12-13 DIAGNOSIS — M2012 Hallux valgus (acquired), left foot: Secondary | ICD-10-CM | POA: Diagnosis not present

## 2023-12-13 DIAGNOSIS — M79671 Pain in right foot: Secondary | ICD-10-CM | POA: Diagnosis not present

## 2023-12-13 DIAGNOSIS — M2011 Hallux valgus (acquired), right foot: Secondary | ICD-10-CM | POA: Diagnosis not present

## 2023-12-13 DIAGNOSIS — G8929 Other chronic pain: Secondary | ICD-10-CM | POA: Diagnosis not present

## 2023-12-13 DIAGNOSIS — M79672 Pain in left foot: Secondary | ICD-10-CM | POA: Diagnosis not present

## 2023-12-28 ENCOUNTER — Other Ambulatory Visit (HOSPITAL_BASED_OUTPATIENT_CLINIC_OR_DEPARTMENT_OTHER): Payer: Self-pay

## 2023-12-28 ENCOUNTER — Other Ambulatory Visit (HOSPITAL_COMMUNITY): Payer: Self-pay

## 2023-12-28 DIAGNOSIS — R2241 Localized swelling, mass and lump, right lower limb: Secondary | ICD-10-CM | POA: Diagnosis not present

## 2023-12-28 MED ORDER — COMIRNATY 30 MCG/0.3ML IM SUSY
0.3000 mL | PREFILLED_SYRINGE | Freq: Once | INTRAMUSCULAR | 0 refills | Status: AC
  Filled 2023-12-28 (×2): qty 0.3, 1d supply, fill #0

## 2023-12-28 MED ORDER — FLUZONE HIGH-DOSE 0.5 ML IM SUSY
0.5000 mL | PREFILLED_SYRINGE | Freq: Once | INTRAMUSCULAR | 0 refills | Status: AC
  Filled 2023-12-28 – 2024-01-03 (×3): qty 0.5, 1d supply, fill #0

## 2024-01-03 ENCOUNTER — Other Ambulatory Visit (HOSPITAL_BASED_OUTPATIENT_CLINIC_OR_DEPARTMENT_OTHER): Payer: Self-pay

## 2024-01-10 ENCOUNTER — Other Ambulatory Visit (HOSPITAL_BASED_OUTPATIENT_CLINIC_OR_DEPARTMENT_OTHER): Payer: Self-pay

## 2024-01-12 DIAGNOSIS — Z1211 Encounter for screening for malignant neoplasm of colon: Secondary | ICD-10-CM | POA: Diagnosis not present

## 2024-01-12 DIAGNOSIS — K635 Polyp of colon: Secondary | ICD-10-CM | POA: Diagnosis not present

## 2024-01-16 DIAGNOSIS — K635 Polyp of colon: Secondary | ICD-10-CM | POA: Diagnosis not present

## 2024-01-18 DIAGNOSIS — R2241 Localized swelling, mass and lump, right lower limb: Secondary | ICD-10-CM | POA: Diagnosis not present

## 2024-01-18 DIAGNOSIS — R209 Unspecified disturbances of skin sensation: Secondary | ICD-10-CM | POA: Diagnosis not present

## 2024-02-01 DIAGNOSIS — Z1231 Encounter for screening mammogram for malignant neoplasm of breast: Secondary | ICD-10-CM | POA: Diagnosis not present

## 2024-03-09 ENCOUNTER — Other Ambulatory Visit (HOSPITAL_COMMUNITY): Payer: Self-pay

## 2024-03-12 ENCOUNTER — Other Ambulatory Visit (HOSPITAL_COMMUNITY): Payer: Self-pay
